# Patient Record
Sex: Male | Born: 1970 | Race: White | Hispanic: No | Marital: Married | Smoking: Former smoker
Health system: Southern US, Community
[De-identification: ages and names within clinical notes are randomized; demographics above are authoritative.]

## PROBLEM LIST (undated history)

## (undated) DIAGNOSIS — T7840XA Allergy, unspecified, initial encounter: Secondary | ICD-10-CM

## (undated) DIAGNOSIS — Z86010 Personal history of colonic polyps: Principal | ICD-10-CM

## (undated) DIAGNOSIS — K219 Gastro-esophageal reflux disease without esophagitis: Secondary | ICD-10-CM

## (undated) DIAGNOSIS — N200 Calculus of kidney: Secondary | ICD-10-CM

## (undated) DIAGNOSIS — R011 Cardiac murmur, unspecified: Secondary | ICD-10-CM

## (undated) DIAGNOSIS — G4730 Sleep apnea, unspecified: Secondary | ICD-10-CM

## (undated) DIAGNOSIS — G43909 Migraine, unspecified, not intractable, without status migrainosus: Secondary | ICD-10-CM

## (undated) DIAGNOSIS — G4733 Obstructive sleep apnea (adult) (pediatric): Secondary | ICD-10-CM

## (undated) DIAGNOSIS — Z9989 Dependence on other enabling machines and devices: Secondary | ICD-10-CM

## (undated) DIAGNOSIS — Z87442 Personal history of urinary calculi: Secondary | ICD-10-CM

## (undated) DIAGNOSIS — R509 Fever, unspecified: Secondary | ICD-10-CM

## (undated) HISTORY — DX: Personal history of colonic polyps: Z86.010

## (undated) HISTORY — DX: Fever, unspecified: R50.9

## (undated) HISTORY — PX: POLYPECTOMY: SHX149

## (undated) HISTORY — DX: Migraine, unspecified, not intractable, without status migrainosus: G43.909

## (undated) HISTORY — PX: BACK SURGERY: SHX140

## (undated) HISTORY — DX: Obstructive sleep apnea (adult) (pediatric): G47.33

## (undated) HISTORY — PX: TONSILLECTOMY: SUR1361

## (undated) HISTORY — PX: COLONOSCOPY: SHX174

## (undated) HISTORY — DX: Allergy, unspecified, initial encounter: T78.40XA

## (undated) HISTORY — DX: Obstructive sleep apnea (adult) (pediatric): Z99.89

---

## 2003-11-11 ENCOUNTER — Emergency Department (HOSPITAL_COMMUNITY): Admission: EM | Admit: 2003-11-11 | Discharge: 2003-11-11 | Payer: Self-pay | Admitting: Emergency Medicine

## 2005-04-29 ENCOUNTER — Ambulatory Visit: Payer: Self-pay | Admitting: Internal Medicine

## 2010-11-22 ENCOUNTER — Emergency Department (HOSPITAL_BASED_OUTPATIENT_CLINIC_OR_DEPARTMENT_OTHER)
Admission: EM | Admit: 2010-11-22 | Discharge: 2010-11-22 | Disposition: A | Discharge status: Home / Self Care | Payer: 59 | Attending: Emergency Medicine | Admitting: Emergency Medicine

## 2010-11-22 ENCOUNTER — Encounter: Payer: Self-pay | Admitting: *Deleted

## 2010-11-22 ENCOUNTER — Emergency Department (INDEPENDENT_AMBULATORY_CARE_PROVIDER_SITE_OTHER): Payer: 59

## 2010-11-22 ENCOUNTER — Emergency Department (HOSPITAL_BASED_OUTPATIENT_CLINIC_OR_DEPARTMENT_OTHER): Payer: 59

## 2010-11-22 DIAGNOSIS — R1031 Right lower quadrant pain: Secondary | ICD-10-CM

## 2010-11-22 DIAGNOSIS — N200 Calculus of kidney: Secondary | ICD-10-CM

## 2010-11-22 DIAGNOSIS — R109 Unspecified abdominal pain: Secondary | ICD-10-CM | POA: Insufficient documentation

## 2010-11-22 DIAGNOSIS — N201 Calculus of ureter: Secondary | ICD-10-CM

## 2010-11-22 DIAGNOSIS — R112 Nausea with vomiting, unspecified: Secondary | ICD-10-CM

## 2010-11-22 DIAGNOSIS — R319 Hematuria, unspecified: Secondary | ICD-10-CM | POA: Insufficient documentation

## 2010-11-22 LAB — DIFFERENTIAL
Basophils Absolute: 0 10*3/uL (ref 0.0–0.1)
Basophils Relative: 0 % (ref 0–1)
Eosinophils Absolute: 0.1 10*3/uL (ref 0.0–0.7)
Eosinophils Relative: 2 % (ref 0–5)
Lymphocytes Relative: 28 % (ref 12–46)
Lymphs Abs: 2 10*3/uL (ref 0.7–4.0)
Monocytes Absolute: 0.7 10*3/uL (ref 0.1–1.0)
Monocytes Relative: 9 % (ref 3–12)
Neutro Abs: 4.3 10*3/uL (ref 1.7–7.7)
Neutrophils Relative %: 61 % (ref 43–77)

## 2010-11-22 LAB — URINALYSIS, ROUTINE W REFLEX MICROSCOPIC
Bilirubin Urine: NEGATIVE
Glucose, UA: NEGATIVE mg/dL
Ketones, ur: NEGATIVE mg/dL
Leukocytes, UA: NEGATIVE
Nitrite: NEGATIVE
Protein, ur: NEGATIVE mg/dL
Specific Gravity, Urine: 1.024 (ref 1.005–1.030)
Urobilinogen, UA: 0.2 mg/dL (ref 0.0–1.0)
pH: 7 (ref 5.0–8.0)

## 2010-11-22 LAB — CBC
HCT: 43.4 % (ref 39.0–52.0)
Hemoglobin: 15.5 g/dL (ref 13.0–17.0)
MCH: 29.9 pg (ref 26.0–34.0)
MCHC: 35.7 g/dL (ref 30.0–36.0)
MCV: 83.8 fL (ref 78.0–100.0)
Platelets: 269 10*3/uL (ref 150–400)
RBC: 5.18 MIL/uL (ref 4.22–5.81)
RDW: 12.9 % (ref 11.5–15.5)
WBC: 7.1 10*3/uL (ref 4.0–10.5)

## 2010-11-22 LAB — BASIC METABOLIC PANEL
BUN: 17 mg/dL (ref 6–23)
CO2: 27 mEq/L (ref 19–32)
Calcium: 9.4 mg/dL (ref 8.4–10.5)
Chloride: 103 mEq/L (ref 96–112)
Creatinine, Ser: 1 mg/dL (ref 0.50–1.35)
GFR calc Af Amer: >60 mL/min (ref >60)
GFR calc non Af Amer: >60 mL/min (ref >60)
Glucose, Bld: 113 mg/dL — ABNORMAL HIGH (ref 70–99)
Potassium: 4.3 mEq/L (ref 3.5–5.1)
Sodium: 140 mEq/L (ref 135–145)

## 2010-11-22 LAB — URINE MICROSCOPIC-ADD ON

## 2010-11-22 MED ORDER — METOCLOPRAMIDE HCL 10 MG PO TABS: 10.0000 mg | ORAL_TABLET | Freq: Four times a day (QID) | ORAL | Status: AC

## 2010-11-22 MED ORDER — SODIUM CHLORIDE 0.9 % IV BOLUS (SEPSIS): 1000.0000 mL | Freq: Once | INTRAVENOUS | Status: AC

## 2010-11-22 MED ORDER — OXYCODONE-ACETAMINOPHEN 5-325 MG PO TABS: 1.0000 | ORAL_TABLET | ORAL | Status: AC | PRN

## 2010-11-22 MED ORDER — TAMSULOSIN HCL 0.4 MG PO CAPS: 0.4000 mg | ORAL_CAPSULE | Freq: Every day | ORAL | Status: DC

## 2010-11-22 NOTE — ED Notes (Signed)
C/o pain in right lower abdomen that goes through to his back, started this morning

## 2010-11-22 NOTE — ED Provider Notes (Addendum)
History     Chief Complaint  Patient presents with  . Abdominal Pain   Patient is a 40 y.o. male presenting with abdominal pain. The history is provided by the patient.  Abdominal Pain The primary symptoms of the illness include abdominal pain and nausea. The primary symptoms of the illness do not include fever. The current episode started 3 to 5 hours ago. The onset of the illness was gradual.  The patient has not had a change in bowel habit. Symptoms associated with the illness do not include chills.    History reviewed. No pertinent past medical history.  Past Surgical History  Procedure Date  . Tonsillectomy     History reviewed. No pertinent family history.  History  Substance Use Topics  . Smoking status: Never Smoker   . Smokeless tobacco: Not on file  . Alcohol Use: Yes     occasional      Review of Systems  Constitutional: Negative.  Negative for fever and chills.  HENT: Negative.   Respiratory: Negative.   Cardiovascular: Negative.   Gastrointestinal: Positive for nausea and abdominal pain.  Genitourinary: Negative.   Musculoskeletal: Negative.   Skin: Negative.   Neurological: Negative.     Physical Exam  BP 135/90  Pulse 73  Temp(Src) 98.2 F (36.8 C) (Oral)  Resp 22  SpO2 99%  Physical Exam  Constitutional: He appears well-developed and well-nourished.  HENT:  Head: Normocephalic.  Neck: Normal range of motion. Neck supple.  Cardiovascular: Normal rate and regular rhythm.   Pulmonary/Chest: Effort normal and breath sounds normal.  Abdominal: Soft. Bowel sounds are normal. He exhibits no mass. There is tenderness. There is no rebound and no guarding.    Musculoskeletal: Normal range of motion.  Neurological: He is alert. No cranial nerve deficit.  Skin: Skin is warm and dry. No rash noted.  Psychiatric: He has a normal mood and affect.    ED Course  Procedures Patient has no RLQ tenderness and has blood in urine - favor kidney stone  over appy. CT changed to stone protocol study. Patient continues to be pain free. Re-eval: 14:55 - patient pain free. CT results confirm ureteral stone and also confirms normal appendix. Will discharge home.  MDM       Rodena Medin, PA 11/22/10 1456  Rodena Medin, PA 11/22/10 1504  Rodena Medin, PA 03/13/11 1009  Rodena Medin, PA 04/02/11 1343

## 2011-03-17 NOTE — ED Provider Notes (Signed)
Evaluation and management procedures were performed by the PA/NP under my supervision/collaboration  Genise Strack D Kameela Leipold, MD 03/17/11 0118 

## 2011-04-05 NOTE — ED Provider Notes (Signed)
Evaluation and management procedures were performed by the PA/NP under my supervision/collaboration.    Felisa Bonier, MD 04/05/11 [phone]

## 2012-12-19 ENCOUNTER — Other Ambulatory Visit: Payer: Self-pay | Admitting: Orthopedic Surgery

## 2012-12-19 NOTE — H&P (Signed)
Jonatha Gagen is an 42 y.o. male.   Chief Complaint: back and left leg pain HPI: The patient is a 42 year old male being followed for their back pain. They are now 6 weeks out from most recent flare up. Symptoms reported today include: pain (left buttock), weakness, numbness (left great toe) and leg pain (left). Current treatment includes: physical therapy and Percocet, Robaxin, and Neurontin (improved numbness slightly). The patient reports their current pain level to be mild.  The physical therapy helped his leg pain slightly. It was traction, he is going to convert that to a home program.  PMH GERD Allergic urticaria Nephrolithiasis Sleep apnea   Past Surgical History  Procedure Laterality Date  . Tonsillectomy    nasal septoplasty Sinus surgery  No family history on file. Social History:  reports that he has never smoked. He does not have any smokeless tobacco history on file. He reports that  drinks alcohol. He reports that he does not use illicit drugs. *Former smoker  Allergies:  Allergies  Allergen Reactions  . Aspirin   . Ibuprofen   . Penicillins      (Not in a hospital admission)  No results found for this or any previous visit (from the past 48 hour(s)). No results found.  Review of Systems  Constitutional: Negative.   HENT: Negative.   Eyes: Negative.   Respiratory: Negative.   Cardiovascular: Negative.   Gastrointestinal: Negative.   Genitourinary: Negative.   Musculoskeletal: Positive for back pain.  Skin: Negative.   Neurological: Positive for sensory change and focal weakness.  Endo/Heme/Allergies: Negative.   Psychiatric/Behavioral: Negative.     There were no vitals taken for this visit. Physical Exam  Constitutional: He is oriented to person, place, and time. He appears well-developed and well-nourished. He appears distressed.  HENT:  Head: Normocephalic and atraumatic.  Eyes: Conjunctivae and EOM are normal. Pupils are equal, round, and  reactive to light.  Neck: Normal range of motion. Neck supple.  Cardiovascular: Normal rate and regular rhythm.   Respiratory: Effort normal and breath sounds normal.  GI: Soft. Bowel sounds are normal.  Musculoskeletal:  On exam he is upright in moderate distress. Mood and affect are appropriate. Straight leg raise on the left produces marked buttock, thigh, and calf pain; on the right it is negative. Decreased sensation L5 dermatome on the left compared to the right. EHL is 4/5. He has slightly diminished plantar flexion.  Neurological: He is alert and oriented to person, place, and time. He has normal reflexes.  Skin: Skin is warm and dry.    xrays Lspine Three views demonstrate disc degeneration at 3-4, 4-5, and 5-1. No instability in flexion and extension.  MRI demonstrates predominantly disc herniation at 4-5, compression of the 5 root. There is lateral recess stenosis, multifactorial, at 5-1. Central disc at 3-4.  Assessment/Plan 1. 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 2. He does have radicular pain into the S1 nerve root distribution as well on the left, not at the L4 nerve root distribution. He does have lateral recess stenosis and protrusion there as well. He does not have any neurotension signs or focal neurological deficits on the right.  I had an extensive discussion with the patient concerning current pathology, relevant anatomy and treatment options. He is close to three and a half to four weeks into his symptomatology. He has tried therapy, analgesics,  activity modifications, the presence of a neurocompressive lesion with myotomal weakness and dermatomal dysesthesias not improving, recommend decompression at 4-5. Very well may be appropriate to decompress 5-1 as well. There is disc degeneration and  disc herniation at 3-4, he has no 4 symptoms.  At this point in time the difficult issue is that he does have multilevel disc degeneration. He has predominantly buttock and leg pain. He has had history of back pain and he does have significant disc degeneration at those levels. He has had overwhelming pain generators, the compression of the 5 root and I would recommend micro lumbar decompression of the 5 root. We, however, discussed the risks and benefits.  I had an extensive discussion of the risks and benefits of lumbar decompression with the patient including bleeding, infection, damage to neurovascular structures, epidural fibrosis, CSF leakage requiring repair. We also discussed increase in pain, adjacent segment disease, recurrent disc herniation, need for future surgery including repeat decompression and/or fusion. We also discussed risks of postoperative hematoma, paralysis, anesthetic complications including DVT, PE, death, cardiopulmonary dysfunction. In addition, the perioperative and postoperative courses were discussed in detail including the rehabilitative time and return to functional activity and work. I provided the patient with an illustrated handout and utilized the appropriate surgical models.  Particularly discussed the requirement for an associated fusion. Again, with his three level disease any fusion at 4-5 and 5-1 would require an extension to 3-4 or at least have a significant risk for adjacent segment disease in the near future. Weight reduction, activity modifications would be of benefit as well. I do not feel series of epidurals would be appropriate given the presence of a neurocompressive lesion. I do feel that this should be performed on an urgent basis within a week. However, they are going to obtain a second opinion, and again we are trying to avoid the presence of progressive neurological deficit, foot drop, and etc. I have indicated that if it dramatically worsens  he should call, consider emergent decompression. We will wait to hear from them.  Plan: microlumbar decompression L4-5, L5-S1 left   BISSELL, JACLYN M. for Dr. Shelle Iron 12/19/2012, 1:57 PM

## 2012-12-29 ENCOUNTER — Encounter (HOSPITAL_COMMUNITY): Payer: Self-pay | Admitting: Pharmacy Technician

## 2012-12-29 NOTE — Patient Instructions (Signed)
Chaka Jefferys  12/29/2012   Your procedure is scheduled on:  01/04/13               Surgery 1000am-12noon  Report to [facility] at      0730 AM.  Call this number if you have problems the morning of surgery: [phone]   Remember:   Do not eat food or drink liquids after midnight.   Take these medicines the morning of surgery with A SIP OF WATER:    Do not wear jewelry,   Do not wear lotions, powders, or perfumes.    Men may shave face and neck.  Do not bring valuables to the hospital.  Contacts, dentures or bridgework may not be worn into surgery.  Leave suitcase in the car. After surgery it may be brought to your room.  For patients admitted to the hospital, checkout time is 11:00 AM the day of  discharge.     SEE CHG INSTRUCTION SHEET    Please read over the following fact sheets that you were given: MRSA Information, coughing and deep breathing exercises, leg exercises, Incentive Spirometry Fact Sheet                Failure to comply with these instructions may result in cancellation of your surgery.                Patient Signature ____________________________              Nurse Signature _____________________________

## 2012-12-30 ENCOUNTER — Encounter (HOSPITAL_COMMUNITY)
Admission: RE | Admit: 2012-12-30 | Discharge: 2012-12-30 | Disposition: A | Discharge status: Home / Self Care | Payer: BC Managed Care – PPO | Source: Ambulatory Visit | Attending: Specialist | Admitting: Specialist

## 2012-12-30 ENCOUNTER — Encounter (HOSPITAL_COMMUNITY): Payer: Self-pay

## 2012-12-30 ENCOUNTER — Ambulatory Visit (HOSPITAL_COMMUNITY)
Admission: RE | Admit: 2012-12-30 | Discharge: 2012-12-30 | Disposition: A | Discharge status: Home / Self Care | Payer: BC Managed Care – PPO | Source: Ambulatory Visit | Attending: Orthopedic Surgery | Admitting: Orthopedic Surgery

## 2012-12-30 DIAGNOSIS — M5137 Other intervertebral disc degeneration, lumbosacral region: Secondary | ICD-10-CM | POA: Insufficient documentation

## 2012-12-30 DIAGNOSIS — M51379 Other intervertebral disc degeneration, lumbosacral region without mention of lumbar back pain or lower extremity pain: Secondary | ICD-10-CM | POA: Insufficient documentation

## 2012-12-30 HISTORY — DX: Gastro-esophageal reflux disease without esophagitis: K21.9

## 2012-12-30 HISTORY — DX: Cardiac murmur, unspecified: R01.1

## 2012-12-30 HISTORY — DX: Calculus of kidney: N20.0

## 2012-12-30 HISTORY — DX: Sleep apnea, unspecified: G47.30

## 2012-12-30 LAB — CBC
HCT: 43.4 % (ref 39.0–52.0)
Hemoglobin: 15.3 g/dL (ref 13.0–17.0)
MCH: 30.2 pg (ref 26.0–34.0)
MCHC: 35.3 g/dL (ref 30.0–36.0)
MCV: 85.6 fL (ref 78.0–100.0)
Platelets: 262 10*3/uL (ref 150–400)
RBC: 5.07 MIL/uL (ref 4.22–5.81)
RDW: 12.6 % (ref 11.5–15.5)
WBC: 6.8 10*3/uL (ref 4.0–10.5)

## 2012-12-30 LAB — BASIC METABOLIC PANEL
BUN: 16 mg/dL (ref 6–23)
CO2: 30 mEq/L (ref 19–32)
Calcium: 9.6 mg/dL (ref 8.4–10.5)
Chloride: 105 mEq/L (ref 96–112)
Creatinine, Ser: 1.31 mg/dL (ref 0.50–1.35)
GFR calc Af Amer: 77 mL/min — ABNORMAL LOW (ref >90)
GFR calc non Af Amer: 66 mL/min — ABNORMAL LOW (ref >90)
Glucose, Bld: 100 mg/dL — ABNORMAL HIGH (ref 70–99)
Potassium: 4.4 mEq/L (ref 3.5–5.1)
Sodium: 141 mEq/L (ref 135–145)

## 2012-12-30 LAB — SURGICAL PCR SCREEN
MRSA, PCR: NEGATIVE
Staphylococcus aureus: NEGATIVE

## 2013-01-02 NOTE — Progress Notes (Signed)
Patient allergic to penicillins- causes hives.  Ancef order preop.  Surgery on 01/04/13.

## 2013-01-04 ENCOUNTER — Inpatient Hospital Stay (HOSPITAL_COMMUNITY)
Admission: RE | Admit: 2013-01-04 | Discharge: 2013-01-04 | DRG: 758 | Disposition: A | Discharge status: Home / Self Care | Payer: BC Managed Care – PPO | Source: Ambulatory Visit | Attending: Specialist | Admitting: Specialist

## 2013-01-04 ENCOUNTER — Ambulatory Visit (HOSPITAL_COMMUNITY): Payer: BC Managed Care – PPO

## 2013-01-04 ENCOUNTER — Encounter (HOSPITAL_COMMUNITY)
Admission: RE | Disposition: A | Discharge status: Home / Self Care | Payer: Self-pay | Source: Ambulatory Visit | Attending: Specialist

## 2013-01-04 ENCOUNTER — Encounter (HOSPITAL_COMMUNITY): Payer: Self-pay | Admitting: Certified Registered"

## 2013-01-04 ENCOUNTER — Ambulatory Visit (HOSPITAL_COMMUNITY): Payer: BC Managed Care – PPO | Admitting: Certified Registered"

## 2013-01-04 ENCOUNTER — Encounter (HOSPITAL_COMMUNITY): Payer: Self-pay

## 2013-01-04 DIAGNOSIS — Z880 Allergy status to penicillin: Secondary | ICD-10-CM

## 2013-01-04 DIAGNOSIS — G4730 Sleep apnea, unspecified: Secondary | ICD-10-CM | POA: Diagnosis present

## 2013-01-04 DIAGNOSIS — Z87442 Personal history of urinary calculi: Secondary | ICD-10-CM

## 2013-01-04 DIAGNOSIS — M5126 Other intervertebral disc displacement, lumbar region: Secondary | ICD-10-CM

## 2013-01-04 DIAGNOSIS — K219 Gastro-esophageal reflux disease without esophagitis: Secondary | ICD-10-CM | POA: Diagnosis present

## 2013-01-04 HISTORY — PX: LUMBAR LAMINECTOMY/DECOMPRESSION MICRODISCECTOMY: SHX5026

## 2013-01-04 SURGERY — LUMBAR LAMINECTOMY/DECOMPRESSION MICRODISCECTOMY 2 LEVELS
Anesthesia: General | Site: Back | Laterality: Left | Wound class: Clean

## 2013-01-04 MED ORDER — MEPERIDINE HCL 50 MG/ML IJ SOLN: 6.2500 mg | INTRAMUSCULAR | Status: DC | PRN

## 2013-01-04 MED ORDER — DEXTROSE 5 % IV SOLN
3.0000 g | Freq: Once | INTRAVENOUS | Status: AC
  Administered 2013-01-04: 3 g via INTRAVENOUS
  Filled 2013-01-04: qty 3000

## 2013-01-04 MED ORDER — LACTATED RINGERS IV SOLN: INTRAVENOUS | Status: DC

## 2013-01-04 MED ORDER — LIDOCAINE HCL (CARDIAC) 20 MG/ML IV SOLN
INTRAVENOUS | Status: DC | PRN
  Administered 2013-01-04: 100 mg via INTRAVENOUS

## 2013-01-04 MED ORDER — ZOLPIDEM TARTRATE 5 MG PO TABS: 5.0000 mg | ORAL_TABLET | Freq: Every evening | ORAL | Status: DC | PRN

## 2013-01-04 MED ORDER — BISACODYL 5 MG PO TBEC: 5.0000 mg | DELAYED_RELEASE_TABLET | Freq: Every day | ORAL | Status: DC | PRN

## 2013-01-04 MED ORDER — SODIUM CHLORIDE 0.9 % IV SOLN: 250.0000 mL | INTRAVENOUS | Status: DC

## 2013-01-04 MED ORDER — ONDANSETRON HCL 4 MG/2ML IJ SOLN: 4.0000 mg | INTRAMUSCULAR | Status: DC | PRN

## 2013-01-04 MED ORDER — METHOCARBAMOL 500 MG PO TABS: 500.0000 mg | ORAL_TABLET | Freq: Four times a day (QID) | ORAL | Status: DC | PRN

## 2013-01-04 MED ORDER — CEFAZOLIN SODIUM-DEXTROSE 2-3 GM-% IV SOLR: 2.0000 g | INTRAVENOUS | Status: DC

## 2013-01-04 MED ORDER — ACETAMINOPHEN 650 MG RE SUPP: 650.0000 mg | RECTAL | Status: DC | PRN

## 2013-01-04 MED ORDER — HYDROMORPHONE HCL PF 1 MG/ML IJ SOLN
0.2500 mg | INTRAMUSCULAR | Status: DC | PRN
  Administered 2013-01-04: 0.5 mg via INTRAVENOUS

## 2013-01-04 MED ORDER — ACETAMINOPHEN 325 MG PO TABS: 650.0000 mg | ORAL_TABLET | ORAL | Status: DC | PRN

## 2013-01-04 MED ORDER — HYDROCODONE-ACETAMINOPHEN 5-325 MG PO TABS: 1.0000 | ORAL_TABLET | ORAL | Status: DC | PRN

## 2013-01-04 MED ORDER — NEOSTIGMINE METHYLSULFATE 1 MG/ML IJ SOLN
INTRAMUSCULAR | Status: DC | PRN
  Administered 2013-01-04: 4 mg via INTRAVENOUS

## 2013-01-04 MED ORDER — ALUM & MAG HYDROXIDE-SIMETH 200-200-20 MG/5ML PO SUSP: 30.0000 mL | Freq: Four times a day (QID) | ORAL | Status: DC | PRN

## 2013-01-04 MED ORDER — MENTHOL 3 MG MT LOZG: 1.0000 | LOZENGE | OROMUCOSAL | Status: DC | PRN

## 2013-01-04 MED ORDER — SENNOSIDES-DOCUSATE SODIUM 8.6-50 MG PO TABS
1.0000 | ORAL_TABLET | Freq: Every evening | ORAL | Status: DC | PRN
  Filled 2013-01-04: qty 1

## 2013-01-04 MED ORDER — DEXTROSE 5 % IV SOLN: 3.0000 g | INTRAVENOUS | Status: DC | PRN

## 2013-01-04 MED ORDER — MAGNESIUM CITRATE PO SOLN: 1.0000 | Freq: Once | ORAL | Status: DC | PRN

## 2013-01-04 MED ORDER — DEXTROSE 5 % IV SOLN
3.0000 g | Freq: Three times a day (TID) | INTRAVENOUS | Status: DC
  Administered 2013-01-04: 3 g via INTRAVENOUS
  Filled 2013-01-04: qty 3000

## 2013-01-04 MED ORDER — SODIUM CHLORIDE 0.9 % IJ SOLN: 3.0000 mL | Freq: Two times a day (BID) | INTRAMUSCULAR | Status: DC

## 2013-01-04 MED ORDER — SODIUM CHLORIDE 0.9 % IR SOLN
Status: DC | PRN
  Administered 2013-01-04: 11:00:00

## 2013-01-04 MED ORDER — BUPIVACAINE-EPINEPHRINE 0.5% -1:200000 IJ SOLN
INTRAMUSCULAR | Status: DC | PRN
  Administered 2013-01-04: 20 mL

## 2013-01-04 MED ORDER — PROMETHAZINE HCL 25 MG/ML IJ SOLN: 6.2500 mg | INTRAMUSCULAR | Status: DC | PRN

## 2013-01-04 MED ORDER — DEXAMETHASONE SODIUM PHOSPHATE 10 MG/ML IJ SOLN
INTRAMUSCULAR | Status: DC | PRN
  Administered 2013-01-04: 10 mg via INTRAVENOUS

## 2013-01-04 MED ORDER — ROCURONIUM BROMIDE 100 MG/10ML IV SOLN
INTRAVENOUS | Status: DC | PRN
  Administered 2013-01-04: 45 mg via INTRAVENOUS
  Administered 2013-01-04: 5 mg via INTRAVENOUS

## 2013-01-04 MED ORDER — SUCCINYLCHOLINE CHLORIDE 20 MG/ML IJ SOLN
INTRAMUSCULAR | Status: DC | PRN
  Administered 2013-01-04: 120 mg via INTRAVENOUS

## 2013-01-04 MED ORDER — ONDANSETRON HCL 4 MG/2ML IJ SOLN
INTRAMUSCULAR | Status: DC | PRN
  Administered 2013-01-04: 4 mg via INTRAVENOUS

## 2013-01-04 MED ORDER — SODIUM CHLORIDE 0.9 % IJ SOLN: 3.0000 mL | INTRAMUSCULAR | Status: DC | PRN

## 2013-01-04 MED ORDER — FENTANYL CITRATE 0.05 MG/ML IJ SOLN
INTRAMUSCULAR | Status: DC | PRN
  Administered 2013-01-04 (×3): 50 ug via INTRAVENOUS
  Administered 2013-01-04: 100 ug via INTRAVENOUS

## 2013-01-04 MED ORDER — OXYCODONE-ACETAMINOPHEN 7.5-325 MG PO TABS: 1.0000 | ORAL_TABLET | ORAL | Status: DC | PRN

## 2013-01-04 MED ORDER — CHLORHEXIDINE GLUCONATE 4 % EX LIQD
60.0000 mL | Freq: Once | CUTANEOUS | Status: DC
  Filled 2013-01-04: qty 60

## 2013-01-04 MED ORDER — SODIUM CHLORIDE 0.45 % IV SOLN: INTRAVENOUS | Status: DC

## 2013-01-04 MED ORDER — THROMBIN 5000 UNITS EX SOLR
CUTANEOUS | Status: DC | PRN
  Administered 2013-01-04: 10000 [IU] via TOPICAL

## 2013-01-04 MED ORDER — GLYCOPYRROLATE 0.2 MG/ML IJ SOLN
INTRAMUSCULAR | Status: DC | PRN
  Administered 2013-01-04: 0.6 mg via INTRAVENOUS

## 2013-01-04 MED ORDER — CLINDAMYCIN PHOSPHATE 900 MG/50ML IV SOLN
900.0000 mg | Freq: Once | INTRAVENOUS | Status: AC
  Administered 2013-01-04: 900 mg via INTRAVENOUS

## 2013-01-04 MED ORDER — OXYCODONE-ACETAMINOPHEN 5-325 MG PO TABS: 1.0000 | ORAL_TABLET | ORAL | Status: DC | PRN

## 2013-01-04 MED ORDER — PROPOFOL 10 MG/ML IV BOLUS
INTRAVENOUS | Status: DC | PRN
  Administered 2013-01-04: 200 mg via INTRAVENOUS

## 2013-01-04 MED ORDER — MIDAZOLAM HCL 5 MG/5ML IJ SOLN
INTRAMUSCULAR | Status: DC | PRN
  Administered 2013-01-04: 2 mg via INTRAVENOUS

## 2013-01-04 MED ORDER — PHENOL 1.4 % MT LIQD: 1.0000 | OROMUCOSAL | Status: DC | PRN

## 2013-01-04 MED ORDER — DOCUSATE SODIUM 100 MG PO CAPS: 100.0000 mg | ORAL_CAPSULE | Freq: Two times a day (BID) | ORAL | Status: DC

## 2013-01-04 MED ORDER — METHOCARBAMOL 100 MG/ML IJ SOLN
500.0000 mg | Freq: Four times a day (QID) | INTRAVENOUS | Status: DC | PRN
  Administered 2013-01-04: 500 mg via INTRAVENOUS
  Filled 2013-01-04: qty 5

## 2013-01-04 MED ORDER — LACTATED RINGERS IV SOLN
INTRAVENOUS | Status: DC
  Administered 2013-01-04: 1000 mL via INTRAVENOUS
  Administered 2013-01-04: 12:00:00 via INTRAVENOUS

## 2013-01-04 MED ORDER — GABAPENTIN 300 MG PO CAPS
300.0000 mg | ORAL_CAPSULE | Freq: Three times a day (TID) | ORAL | Status: DC
  Administered 2013-01-04: 300 mg via ORAL
  Filled 2013-01-04 (×2): qty 1

## 2013-01-04 MED ORDER — HYDROMORPHONE HCL PF 1 MG/ML IJ SOLN: 0.5000 mg | INTRAMUSCULAR | Status: DC | PRN

## 2013-01-04 SURGICAL SUPPLY — 60 items
APL SKNCLS STERI-STRIP NONHPOA (GAUZE/BANDAGES/DRESSINGS) ×1
BAG SPEC THK2 15X12 ZIP CLS (MISCELLANEOUS) ×1
BAG ZIPLOCK 12X15 (MISCELLANEOUS) ×2 IMPLANT
BENZOIN TINCTURE PRP APPL 2/3 (GAUZE/BANDAGES/DRESSINGS) ×3 IMPLANT
BLADE SURG ROTATE 9660 (MISCELLANEOUS) ×1 IMPLANT
CHLORAPREP W/TINT 26ML (MISCELLANEOUS) IMPLANT
CLEANER TIP ELECTROSURG 2X2 (MISCELLANEOUS) ×2 IMPLANT
CLOSURE STERI-STRIP 1/4X4 (GAUZE/BANDAGES/DRESSINGS) ×1 IMPLANT
CLOTH 2% CHLOROHEXIDINE 3PK (PERSONAL CARE ITEMS) ×2 IMPLANT
CLOTH BEACON ORANGE TIMEOUT ST (SAFETY) ×2 IMPLANT
DECANTER SPIKE VIAL GLASS SM (MISCELLANEOUS) ×2 IMPLANT
DRAPE MICROSCOPE LEICA (MISCELLANEOUS) ×2 IMPLANT
DRAPE POUCH INSTRU U-SHP 10X18 (DRAPES) ×2 IMPLANT
DRAPE SURG 17X11 SM STRL (DRAPES) ×2 IMPLANT
DRSG AQUACEL AG ADV 3.5X 6 (GAUZE/BANDAGES/DRESSINGS) ×1 IMPLANT
DRSG EMULSION OIL 3X3 NADH (GAUZE/BANDAGES/DRESSINGS) IMPLANT
DRSG PAD ABDOMINAL 8X10 ST (GAUZE/BANDAGES/DRESSINGS) IMPLANT
DRSG TELFA 4X5 ISLAND ADH (GAUZE/BANDAGES/DRESSINGS) IMPLANT
DURAFORM SPONGE 2X2 SINGLE (Neuro Prosthesis/Implant) ×2 IMPLANT
DURAPREP 26ML APPLICATOR (WOUND CARE) ×2 IMPLANT
DURASEAL SPINE SEALANT 3ML (MISCELLANEOUS) ×1 IMPLANT
ELECT BLADE TIP CTD 4 INCH (ELECTRODE) ×1 IMPLANT
ELECT REM PT RETURN 9FT ADLT (ELECTROSURGICAL) ×2
ELECTRODE REM PT RTRN 9FT ADLT (ELECTROSURGICAL) ×1 IMPLANT
GLOVE BIOGEL PI IND STRL 7.5 (GLOVE) ×1 IMPLANT
GLOVE BIOGEL PI IND STRL 8 (GLOVE) ×1 IMPLANT
GLOVE BIOGEL PI INDICATOR 7.5 (GLOVE) ×1
GLOVE BIOGEL PI INDICATOR 8 (GLOVE) ×1
GLOVE SURG SS PI 7.5 STRL IVOR (GLOVE) ×2 IMPLANT
GLOVE SURG SS PI 8.0 STRL IVOR (GLOVE) ×4 IMPLANT
GOWN PREVENTION PLUS LG XLONG (DISPOSABLE) ×3 IMPLANT
GOWN STRL REIN XL XLG (GOWN DISPOSABLE) ×4 IMPLANT
IV CATH 14GX2 1/4 (CATHETERS) ×2 IMPLANT
KIT BASIN OR (CUSTOM PROCEDURE TRAY) ×2 IMPLANT
KIT POSITIONING SURG ANDREWS (MISCELLANEOUS) ×2 IMPLANT
MANIFOLD NEPTUNE II (INSTRUMENTS) ×2 IMPLANT
NDL SPNL 18GX3.5 QUINCKE PK (NEEDLE) ×3 IMPLANT
NEEDLE SPNL 18GX3.5 QUINCKE PK (NEEDLE) ×6 IMPLANT
PATTIES SURGICAL .5 X.5 (GAUZE/BANDAGES/DRESSINGS) IMPLANT
PATTIES SURGICAL .75X.75 (GAUZE/BANDAGES/DRESSINGS) IMPLANT
PATTIES SURGICAL 1X1 (DISPOSABLE) IMPLANT
SPONGE SURGIFOAM ABS GEL 100 (HEMOSTASIS) ×2 IMPLANT
STAPLER VISISTAT (STAPLE) IMPLANT
STRIP CLOSURE SKIN 1/2X4 (GAUZE/BANDAGES/DRESSINGS) IMPLANT
SUT NURALON 4 0 TR CR/8 (SUTURE) ×1 IMPLANT
SUT PROLENE 3 0 PS 2 (SUTURE) IMPLANT
SUT VIC AB 0 CT1 27 (SUTURE)
SUT VIC AB 0 CT1 27XBRD ANTBC (SUTURE) IMPLANT
SUT VIC AB 1 CT1 27 (SUTURE) ×2
SUT VIC AB 1 CT1 27XBRD ANTBC (SUTURE) ×1 IMPLANT
SUT VIC AB 1-0 CT2 27 (SUTURE) ×2 IMPLANT
SUT VIC AB 2-0 CT1 27 (SUTURE) ×2
SUT VIC AB 2-0 CT1 TAPERPNT 27 (SUTURE) ×1 IMPLANT
SUT VIC AB 2-0 CT2 27 (SUTURE) ×3 IMPLANT
SUT VICRYL 0 27 CT2 27 ABS (SUTURE) ×3 IMPLANT
SUT VICRYL 0 UR6 27IN ABS (SUTURE) IMPLANT
SYR 3ML LL SCALE MARK (SYRINGE) ×1 IMPLANT
SYRINGE 10CC LL (SYRINGE) ×4 IMPLANT
TRAY LAMINECTOMY (CUSTOM PROCEDURE TRAY) ×2 IMPLANT
YANKAUER SUCT BULB TIP NO VENT (SUCTIONS) ×2 IMPLANT

## 2013-01-04 NOTE — Interval H&P Note (Signed)
History and Physical Interval Note:  01/04/2013 10:06 AM  Brett Schaefer  has presented today for surgery, with the diagnosis of HNP STENOSIS L4-S1   The various methods of treatment have been discussed with the patient and family. After consideration of risks, benefits and other options for treatment, the patient has consented to  Procedure(s): MICRO LUMBAR DECOMPRESSION L4-S1 LEFT    (2 LEVELS) (Left) as a surgical intervention .  The patient's history has been reviewed, patient examined, no change in status, stable for surgery.  I have reviewed the patient's chart and labs.  Questions were answered to the patient's satisfaction.     Termaine Roupp,Karthik C

## 2013-01-04 NOTE — Anesthesia Postprocedure Evaluation (Signed)
  Anesthesia Post-op Note  Patient: Brett Schaefer  Procedure(s) Performed: Procedure(s) (LRB): MICRO LUMBAR DECOMPRESSION L4-S1 LEFT    (2 LEVELS)Discectomy at L4-L5 (Left)  Patient Location: PACU  Anesthesia Type: General  Level of Consciousness: awake and alert   Airway and Oxygen Therapy: Patient Spontanous Breathing  Post-op Pain: mild  Post-op Assessment: Post-op Vital signs reviewed, Patient's Cardiovascular Status Stable, Respiratory Function Stable, Patent Airway and No signs of Nausea or vomiting  Last Vitals:  Filed Vitals:   01/04/13 1300  BP: 129/60  Pulse: 89  Temp: 37.2 C  Resp: 22    Post-op Vital Signs: stable   Complications: No apparent anesthesia complications

## 2013-01-04 NOTE — Brief Op Note (Signed)
01/04/2013  12:35 PM  PATIENT:  Brett Schaefer  42 y.o. male  PRE-OPERATIVE DIAGNOSIS:  HNP STENOSIS L4-S1   POST-OPERATIVE DIAGNOSIS:  HNP STENOSIS L4-S1   PROCEDURE:  Procedure(s): MICRO LUMBAR DECOMPRESSION L4-S1 LEFT    (2 LEVELS)Discectomy at L4-L5 (Left)  SURGEON:  Surgeon(s) and Role:    * Javier Docker, MD - Primary  PHYSICIAN ASSISTANT:   ASSISTANTS: Bissell   ANESTHESIA:   general  EBL:  Total I/O In: 1000 [I.V.:1000] Out: -   BLOOD ADMINISTERED:none  DRAINS: none   LOCAL MEDICATIONS USED:  MARCAINE     SPECIMEN:  Source of Specimen:  [id number]  DISPOSITION OF SPECIMEN:  PATHOLOGY  COUNTS:  YES  TOURNIQUET:  * No tourniquets in log *  DICTATION: .Other Dictation: Dictation Number    [id number]  PLAN OF CARE: Admit for overnight observation  PATIENT DISPOSITION:  PACU - hemodynamically stable.   Delay start of Pharmacological VTE agent (>24hrs) due to surgical blood loss or risk of bleeding: yes

## 2013-01-04 NOTE — Anesthesia Preprocedure Evaluation (Signed)
Anesthesia Evaluation  Patient identified by MRN, date of birth, ID band Patient awake    Reviewed: Allergy & Precautions, H&P , NPO status , Patient's Chart, lab work & pertinent test results  Airway Mallampati: III TM Distance: >3 FB Neck ROM: Full    Dental no notable dental hx.    Pulmonary sleep apnea and Continuous Positive Airway Pressure Ventilation ,  breath sounds clear to auscultation  Pulmonary exam normal       Cardiovascular negative cardio ROS  Rhythm:Regular Rate:Normal     Neuro/Psych negative neurological ROS  negative psych ROS   GI/Hepatic Neg liver ROS, GERD-  ,  Endo/Other  negative endocrine ROS  Renal/GU negative Renal ROS  negative genitourinary   Musculoskeletal negative musculoskeletal ROS (+)   Abdominal   Peds negative pediatric ROS (+)  Hematology negative hematology ROS (+)   Anesthesia Other Findings Front tooth capped  Reproductive/Obstetrics negative OB ROS                           Anesthesia Physical Anesthesia Plan  ASA: II  Anesthesia Plan: General   Post-op Pain Management:    Induction: Intravenous  Airway Management Planned: Oral ETT  Additional Equipment:   Intra-op Plan:   Post-operative Plan: Extubation in OR  Informed Consent: I have reviewed the patients History and Physical, chart, labs and discussed the procedure including the risks, benefits and alternatives for the proposed anesthesia with the patient or authorized representative who has indicated his/her understanding and acceptance.   Dental advisory given  Plan Discussed with: CRNA  Anesthesia Plan Comments:         Anesthesia Quick Evaluation

## 2013-01-04 NOTE — Op Note (Signed)
NAMEDYLIN, BREEDEN NO.:  [id number]  MEDICAL RECORD NO.:  [id number]  LOCATION:  1602                         FACILITY:  [facility]  PHYSICIAN:  Jene Every, M.D.    DATE OF BIRTH:  [birth year]  DATE OF PROCEDURE:  01/04/2013 DATE OF DISCHARGE:  01/04/2013                              OPERATIVE REPORT   PREOPERATIVE DIAGNOSIS:  Herniated nucleus pulposus, spinal stenosis, L4- 5 and L5-S1, left.  POSTOPERATIVE DIAGNOSIS:  Herniated nucleus pulposus, spinal stenosis, L4-5 and L5-S1, left.  PROCEDURES PERFORMED: 1. Microlumbar decompression L4-5 left with foraminotomies of L4 and     L5 with microdiskectomy, 4-5, left. 2. Microlumbar decompression, L5-S1 with foraminotomies L5-S1,     decompression of the lateral recess.  ANESTHESIA:  General.  ASSISTANT:  Lanna Poche, PA.  He was used for the patient's assistance, positioning in gentle intermittent neural traction, dissection, help under the operating microscope throughout the case.  BRIEF HISTORY:  This is a 42 year old who has had persistent refractory left lower extremity radicular pain.  He had a deficit of his EHL and dorsiflexion and numbness in the L5-S1 nerve root distribution despite rest, activity modification, injections, and Dosepaks.  He was indicated for lumbar decompression at 4-5 and at 5-1.  The patient had elected to continue with conservative treatment for an additional 4 weeks period of time to determine whether there is any improvement in his deficit, however, indicated the duration of neural compression is important in ventral nerve recovery.  Discussed risks and benefits of the procedure including bleeding, infection, damage to neurovascular structures, DVT, PE, anesthetic complications, worsening of symptoms, no change in symptoms, need for fusion in the future.  He did have a disk degeneration at 3-4 and disk without neural compression.  He had no L4 signs.  He did have a  disk at 5-1 central, it was more of a lateral recess displacing the S1 nerve roots in the lateral recess.  He had significant effacement of the 5 root in the lateral recess on the left.  TECHNIQUE:  With the patient in supine position, after induction of adequate general anesthesia, 3 g of Kefzol, he was placed prone on the [place] frame.  All bony prominences were well padded.  We utilized 900 clindamycin as well.  The patient was tested negative for MRSA.  He had multiple healed cutaneous lesions sometime seen with morcellation. After he was placed in the supine position on the [place] frame, all bony prominences were well padded.  Lumbar region was prepped and draped in usual sterile fashion.  Two 18-gauge spinal needle was utilized to localize the 4-5 and 5-1 interspace.  After confirmation, incision was made from below the spinous process of L4 to just above S1, subcutaneous tissue was dissected.  Electrocautery was utilized to achieve hemostasis.  Dorsolumbar fascia identified and divided in line with skin incision.  0.25% Marcaine with epinephrine was utilized to infiltrate the subcuticular region.  McCullough retractor was placed and operating microscope was draped, brought on the surgical field.  Attention was turned towards the most symptomatic level of 4-5 confirmed by radiograph.  Hemilaminotomy of the caudad edge of 4 was performed with a 2  and 3 mm Kerrison preserving the end portion of the pars. Hypertrophic facet was noted.  Ligamentum flavum then detached from the cephalad edge of 5 utilizing a straight microcurette.  Following this, we performed a foraminotomy of 5.  We then removed the ligamentum flavum from the interspace with the neural patty placed beneath the ligamentum flavum.  Noted was the 5 root compressing the lateral recess, it was epidural venous plexus, facet hypertrophy, and ligamentum flavum hypertrophy as well as a disk herniation.  There was epidural  venous plexus tethering the 5 root.  This was cauterized and divided.  We gently mobilized the 5 root medially and decompressed the lateral recess to the medial border of pedicle with a 2 mm Kerrison.  I performed a foraminotomy of L4 as well.  After decompressing the lateral recess, I treated a small extruded fragment with micropituitary.  I performed a vertical annulotomy minimizing that and removed multiple small fragments sub annularly straight and towards the center of the disk.  After multiple passes were made, we then used a Woodson retractor to deliver disk into the disk space, checked beneath the thecal sac, the shoulder, and the axilla of the root as well as into the foramen.  No residual disk herniation was noted after multiple passes were made.  He did have a osteophyte in the central ridge, but that was not compressing the root.  After the full diskectomy and the lateral recess decompression, we had 1 cm of excursion of the 5 root medial to the pedicle without tension.  The nerve root was intact, erythematous, and edematous. Copiously irrigated disk space with antibiotic irrigation.  We had obtained confirmatory radiographs.  Inspection revealed no CSF leakage or active bleeding.  We felt we had satisfactorily decompressed the 5 root.  Attention was turned towards the interlaminar space of 5-1 translated in [place].  In a similar fashion, hemilaminotomy of the caudad edge of 5 was performed with the 2 and 3 mm Kerrison detaching ligamentum flavum from the cephalad edge of S1 utilizing straight microcurette.  I performed careful foraminotomy of S1.  Placed a neuro patty beneath the ligamentum flavum, removed ligamentum flavum from the interspace.  The S1 nerve root was compressed and the lateral recess was gently mobilized medially with the Dumont.  Used a D'Errico and decompressed the lateral recess to the medial border of the pedicle as there was hypertrophy of facet  here noted.  There was a small bulging disk centrally, however, I did not feel it was herniated and following the decompression and foraminotomy, the S1 nerve root, which was compressing the lateral recess was now free at 1 cm of excursion of the S1 nerve root medial to the pedicle without extension.  Confirmatory radiograph obtained.  We checked beneath thecal sac the axilla of the shoulder, roots, and both foramen.  No disk herniation, no lesions were noted.  Copiously irrigated both wounds.  Inspection revealed no CSF leakage or active bleeding.  Dorsolumbar fascia was reapproximated with 1 Vicryl interrupted figure-of-eight sutures, subcu with 2, skin reapproximated with Prolene.  Sterile dressing applied.  Placed supine on the hospital bed, extubated without difficulty, and transported to the recovery room in a satisfactory condition.  The patient tolerated the procedure well.  No complications.  Blood loss was approximately 25 mL.  SPECIMEN:  L4-5 disk to Pathology.     Jene Every, M.D.     Cordelia Pen  D:  01/04/2013  T:  01/04/2013  Job:  [id number]

## 2013-01-04 NOTE — Evaluation (Signed)
Physical Therapy Evaluation Patient Details Name: Brett Schaefer MRN: [id number] DOB: 14-Nov-1970 Today's Date: 01/04/2013 Time: 9147-8295 PT Time Calculation (min): 25 min  PT Assessment / Plan / Recommendation History of Present Illness     Clinical Impression  Pt with p/o decompression of L4-S1 PODO today, tolerated mobility well. Pt with very little pain and more now in surgical area than prior to surgery pain was radicular down posterior B LE (R>L). Pt will very little to no radicular pain at this time. Educated patient extensively on back precautions, best movement and movement progression with rest and ice initially. Pt at Mod I level with basic mobility and will have spouse and family to assist with other things. Pt aware and understands proper body mechanics and demonstrated proper bed mobility for back safety. Pt safe for discharge from our mobility standpoint. No PT f/u needs at this time and no equipment needs.      PT Assessment  Patent does not need any further PT services    Follow Up Recommendations  No PT follow up    Does the patient have the potential to tolerate intense rehabilitation      Barriers to Discharge        Equipment Recommendations  None recommended by PT    Recommendations for Other Services     Frequency      Precautions / Restrictions Precautions Precautions: Back Precaution Comments: back precautions reviewed and demonstarted    Pertinent Vitals/Pain 2/10 in low back area and slightly to left of incision       Mobility  Bed Mobility Bed Mobility: Rolling Left;Left Sidelying to Sit Rolling Left: 5: Supervision Left Sidelying to Sit: 5: Supervision Details for Bed Mobility Assistance: cues for log roll and how to perform for back safety Transfers Transfers: Sit to Stand;Stand to Sit Sit to Stand: 5: Supervision Stand to Sit: 5: Supervision Ambulation/Gait Ambulation/Gait Assistance: 5: Supervision Ambulation Distance (Feet): 120  Feet Assistive device: None Ambulation/Gait Assistance Details: tolerated well with steady gait , moderate speed, step through pattern, no increase in radicular pain with walking or movement.  Gait Pattern: Within Functional Limits Gait velocity: moderate Stairs: No    Exercises     PT Diagnosis:    PT Problem List:   PT Treatment Interventions:       PT Goals(Current goals can be found in the care plan section) Acute Rehab PT Goals Patient Stated Goal: to be able to head home soon PT Goal Formulation: With patient  Visit Information  Last PT Received On: 01/04/13 Assistance Needed: +1       Prior Functioning  Home Living Family/patient expects to be discharged to:: Private residence Living Arrangements: Spouse/significant other Available Help at Discharge: Family Type of Home: House Home Access: Level entry Home Layout: Able to live on main level with bedroom/bathroom (office upstairs) Home Equipment: None Prior Function Level of Independence: Independent 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

## 2013-01-04 NOTE — Progress Notes (Signed)
CPAP and Mask in car.

## 2013-01-04 NOTE — H&P (View-Only) (Signed)
Brett Schaefer is an 42 y.o. male.   Chief Complaint: back and left leg pain HPI: The patient is a 42 year old male being followed for their back pain. They are now 6 weeks out from most recent flare up. Symptoms reported today include: pain (left buttock), weakness, numbness (left great toe) and leg pain (left). Current treatment includes: physical therapy and Percocet, Robaxin, and Neurontin (improved numbness slightly). The patient reports their current pain level to be mild.  The physical therapy helped his leg pain slightly. It was traction, he is going to convert that to a home program.  PMH GERD Allergic urticaria Nephrolithiasis Sleep apnea   Past Surgical History  Procedure Laterality Date  . Tonsillectomy    nasal septoplasty Sinus surgery  No family history on file. Social History:  reports that he has never smoked. He does not have any smokeless tobacco history on file. He reports that  drinks alcohol. He reports that he does not use illicit drugs. *Former smoker  Allergies:  Allergies  Allergen Reactions  . Aspirin   . Ibuprofen   . Penicillins      (Not in a hospital admission)  No results found for this or any previous visit (from the past 48 hour(s)). No results found.  Review of Systems  Constitutional: Negative.   HENT: Negative.   Eyes: Negative.   Respiratory: Negative.   Cardiovascular: Negative.   Gastrointestinal: Negative.   Genitourinary: Negative.   Musculoskeletal: Positive for back pain.  Skin: Negative.   Neurological: Positive for sensory change and focal weakness.  Endo/Heme/Allergies: Negative.   Psychiatric/Behavioral: Negative.     There were no vitals taken for this visit. Physical Exam  Constitutional: He is oriented to person, place, and time. He appears well-developed and well-nourished. He appears distressed.  HENT:  Head: Normocephalic and atraumatic.  Eyes: Conjunctivae and EOM are normal. Pupils are equal, round, and  reactive to light.  Neck: Normal range of motion. Neck supple.  Cardiovascular: Normal rate and regular rhythm.   Respiratory: Effort normal and breath sounds normal.  GI: Soft. Bowel sounds are normal.  Musculoskeletal:  On exam he is upright in moderate distress. Mood and affect are appropriate. Straight leg raise on the left produces marked buttock, thigh, and calf pain; on the right it is negative. Decreased sensation L5 dermatome on the left compared to the right. EHL is 4/5. He has slightly diminished plantar flexion.  Neurological: He is alert and oriented to person, place, and time. He has normal reflexes.  Skin: Skin is warm and dry.    xrays Lspine Three views demonstrate disc degeneration at 3-4, 4-5, and 5-1. No instability in flexion and extension.  MRI demonstrates predominantly disc herniation at 4-5, compression of the 5 root. There is lateral recess stenosis, multifactorial, at 5-1. Central disc at 3-4.  Assessment/Plan 1. 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 2. He does have radicular pain into the S1 nerve root distribution as well on the left, not at the L4 nerve root distribution. He does have lateral recess stenosis and protrusion there as well. He does not have any neurotension signs or focal neurological deficits on the right.  I had an extensive discussion with the patient concerning current pathology, relevant anatomy and treatment options. He is close to three and a half to four weeks into his symptomatology. He has tried therapy, analgesics,   activity modifications, the presence of a neurocompressive lesion with myotomal weakness and dermatomal dysesthesias not improving, recommend decompression at 4-5. Very well may be appropriate to decompress 5-1 as well. There is disc degeneration and  disc herniation at 3-4, he has no 4 symptoms.  At this point in time the difficult issue is that he does have multilevel disc degeneration. He has predominantly buttock and leg pain. He has had history of back pain and he does have significant disc degeneration at those levels. He has had overwhelming pain generators, the compression of the 5 root and I would recommend micro lumbar decompression of the 5 root. We, however, discussed the risks and benefits.  I had an extensive discussion of the risks and benefits of lumbar decompression with the patient including bleeding, infection, damage to neurovascular structures, epidural fibrosis, CSF leakage requiring repair. We also discussed increase in pain, adjacent segment disease, recurrent disc herniation, need for future surgery including repeat decompression and/or fusion. We also discussed risks of postoperative hematoma, paralysis, anesthetic complications including DVT, PE, death, cardiopulmonary dysfunction. In addition, the perioperative and postoperative courses were discussed in detail including the rehabilitative time and return to functional activity and work. I provided the patient with an illustrated handout and utilized the appropriate surgical models.  Particularly discussed the requirement for an associated fusion. Again, with his three level disease any fusion at 4-5 and 5-1 would require an extension to 3-4 or at least have a significant risk for adjacent segment disease in the near future. Weight reduction, activity modifications would be of benefit as well. I do not feel series of epidurals would be appropriate given the presence of a neurocompressive lesion. I do feel that this should be performed on an urgent basis within a week. However, they are going to obtain a second opinion, and again we are trying to avoid the presence of progressive neurological deficit, foot drop, and etc. I have indicated that if it dramatically worsens  he should call, consider emergent decompression. We will wait to hear from them.  Plan: microlumbar decompression L4-5, L5-S1 left   Mairin Lindsley M. for Dr. Beane 12/19/2012, 1:57 PM    

## 2013-01-04 NOTE — Progress Notes (Signed)
Called Dr Shelle Iron for Pharmacy to verify that he still wants ancef even though patient is allergic to penicillin with rxn of hives.

## 2013-01-04 NOTE — Transfer of Care (Signed)
Immediate Anesthesia Transfer of Care Note  Patient: Brett Schaefer  Procedure(s) Performed: Procedure(s): MICRO LUMBAR DECOMPRESSION L4-S1 LEFT    (2 LEVELS)Discectomy at L4-L5 (Left)  Patient Location: PACU  Anesthesia Type:General  Level of Consciousness: awake, alert  and oriented  Airway & Oxygen Therapy: Patient Spontanous Breathing and Patient connected to face mask oxygen  Post-op Assessment: Report given to PACU RN and Post -op Vital signs reviewed and stable  Post vital signs: Reviewed and stable  Complications: No apparent anesthesia complications

## 2013-01-05 ENCOUNTER — Encounter (HOSPITAL_COMMUNITY): Payer: Self-pay | Admitting: Specialist

## 2013-01-05 NOTE — Care Management Note (Signed)
    Page 1 of 1   01/05/2013     4:58:08 PM   CARE MANAGEMENT NOTE 01/05/2013  Patient:  Brett Schaefer,Brett Schaefer   Account Number:  [id number]  Date Initiated:  01/05/2013  Documentation initiated by:  Colleen Can  Subjective/Objective Assessment:   DX HNp/STENOSIS; MICROLUMBAR DECOMPRESSION l4-s1, DISCECTOMY l4-5     Action/Plan:   DISCHARGED TO HOME   Anticipated DC Date:     Anticipated DC Plan:        DC Planning Services  CM consult      Choice offered to / List presented to:             Status of service:  Completed, signed off Medicare Important Message given?   (If response is "NO", the following Medicare IM given date fields will be blank) Date Medicare IM given:   Date Additional Medicare IM given:    Discharge Disposition:  HOME/SELF CARE  Per UR Regulation:  Reviewed for med. necessity/level of care/duration of stay  If discussed at Long Length of Stay Meetings, dates discussed:    Comments:  01/05/2013 Colleen Can SN RN zCCm [phone] Pt discharged 01/04/2013 on same day of admission. NO HH or DME needs.

## 2013-01-05 NOTE — Discharge Summary (Signed)
Physician Discharge Summary   Patient ID: Brett Schaefer MRN: [id number] DOB/AGE: 41-12-1970 41 y.o.  Admit date: 01/04/2013 Discharge date: 01/05/2013  Primary Diagnosis:   HNP STENOSIS L4-S1   Admission Diagnoses:  Past Medical History  Diagnosis Date  . Heart murmur   . Sleep apnea     cpap , settings at 10  . Kidney stone   . GERD (gastroesophageal reflux disease)    Discharge Diagnoses:   Principal Problem:   HNP (herniated nucleus pulposus), lumbar  Procedure:  Procedure(s) (LRB): MICRO LUMBAR DECOMPRESSION L4-S1 LEFT    (2 LEVELS)Discectomy at L4-L5 (Left)   Consults: None  HPI:  see H&P    Laboratory Data: Hospital Outpatient Visit on 12/30/2012  Component Date Value Range Status  . MRSA, PCR 12/30/2012 NEGATIVE  NEGATIVE Final  . Staphylococcus aureus 12/30/2012 NEGATIVE  NEGATIVE Final   Comment:                                 The Xpert SA Assay (FDA                          approved for NASAL specimens                          in patients over 15 years of age),                          is one component of                          a comprehensive surveillance                          program.  Test performance has                          been validated by [employer] for patients greater                          than or equal to 16 year old.                          It is not intended                          to diagnose infection nor to                          guide or monitor treatment.  . WBC 12/30/2012 6.8  4.0 - 10.5 K/uL Final  . RBC 12/30/2012 5.07  4.22 - 5.81 MIL/uL Final  . Hemoglobin 12/30/2012 15.3  13.0 - 17.0 g/dL Final  . HCT 40/98/1191 43.4  39.0 - 52.0 % Final  . MCV 12/30/2012 85.6  78.0 - 100.0 fL Final  . MCH 12/30/2012 30.2  26.0 - 34.0 pg Final  . MCHC 12/30/2012 35.3  30.0 - 36.0 g/dL Final  . RDW 47/82/9562 12.6  11.5 - 15.5 %  Final  . Platelets 12/30/2012 262  150 - 400 K/uL Final  . Sodium  12/30/2012 141  135 - 145 mEq/L Final  . Potassium 12/30/2012 4.4  3.5 - 5.1 mEq/L Final  . Chloride 12/30/2012 105  96 - 112 mEq/L Final  . CO2 12/30/2012 30  19 - 32 mEq/L Final  . Glucose, Bld 12/30/2012 100* 70 - 99 mg/dL Final  . BUN 47/82/9562 16  6 - 23 mg/dL Final  . Creatinine, Ser 12/30/2012 1.31  0.50 - 1.35 mg/dL Final  . Calcium 13/12/6576 9.6  8.4 - 10.5 mg/dL Final  . GFR calc non Af Amer 12/30/2012 66* >90 mL/min Final  . GFR calc Af Amer 12/30/2012 77* >90 mL/min Final   Comment: (NOTE)                          The eGFR has been calculated using the CKD EPI equation.                          This calculation has not been validated in all clinical situations.                          eGFR's persistently <90 mL/min signify possible Chronic Kidney                          Disease.   No results found for this basename: HGB,  in the last 72 hours No results found for this basename: WBC, RBC, HCT, PLT,  in the last 72 hours No results found for this basename: NA, K, CL, CO2, BUN, CREATININE, GLUCOSE, CALCIUM,  in the last 72 hours No results found for this basename: LABPT, INR,  in the last 72 hours  X-Rays:Dg Lumbar Spine 2-3 Views  12/30/2012   *RADIOLOGY REPORT*  Clinical Data: Low back pain  LUMBAR SPINE - 2-3 VIEW  Comparison: None.  Findings: No fracture or spondylolisthesis is noted.  Minimal disc space narrowing is noted at L5 S1 consistent with degenerative disc disease.  Posterior facet joints appear normal. No soft tissue abnormality is noted.  IMPRESSION: Minimal degenerative disc disease is noted at L5 S1.  No acute abnormality seen in the lumbar spine.   Original Report Authenticated By: Lupita Raider.,  M.D.   Dg Spine Portable 1 View  01/04/2013   *RADIOLOGY REPORT*  Clinical Data: Intraoperative imaging for lumbar decompression.  PORTABLE SPINE - 1 VIEW  Comparison: 01/04/2013 at 1101 hours.  Findings: Single intraoperative cross-table lateral view of the lumbar  spine is submitted.  Numbering system utilized on the prior exam is preserved.  Surgical instrument tips project posterior to the superior endplate of L5 and over the L5-S1 facet joints.  IMPRESSION: Intraoperative localization, as above.   Original Report Authenticated By: Leanna Battles, M.D.   Dg Spine Portable 1 View  01/04/2013   *RADIOLOGY REPORT*  Clinical Data: Lumbar decompression, intraoperative exam.  PORTABLE SPINE - 1 VIEW  Comparison: 01/04/2013 at 1043 hours.  Findings: A single intraoperative cross-table lateral view of the lumbar spine is submitted.  Numbering system utilized on the prior examination is preserved.  Surgical instrument tips project over the L4-5 facet joints and L5-S1 facet joints.  IMPRESSION: Intraoperative localization at L4-5 and L5-S1.   Original Report Authenticated By: Leanna Battles, M.D.   Dg Spine Portable 1 View  01/04/2013   *RADIOLOGY REPORT*  Clinical Data: Intraoperative localization film.  Patient for L4-S1 lumbar surgery.  PORTABLE SPINE - 1 VIEW  Comparison: None.  Findings: Two metallic probes are identified.  One of the probes is at approximately the level of the L5 pedicles.  A second probe is at the level of mid S1.  IMPRESSION: Localization as above.   Original Report Authenticated By: Holley Dexter, M.D.    EKG:No orders found for this or any previous visit.   Hospital Course: Patient was admitted to [facility] and taken to the OR and underwent the above state procedure without complications.  Patient tolerated the procedure well and was later transferred to the recovery room and then to the orthopaedic floor for postoperative care.  They were given PO and IV analgesics for pain control following their surgery.  They were given postoperative antibiotics.   PT was consulted postop to assist with mobility and transfers.  The patient was allowed to be WBAT with therapy and was taught back precautions. Discharge planning was consulted to  help with postop disposition and equipment needs.  Patient reported resolution of his pain and was able to be D/C'd home same day of surgery. They were given discharge instructions and dressing directions.  They were instructed on when to follow up in the office with Dr. Shelle Iron.  Discharge Medications: Prior to Admission medications   Medication Sig Start Date End Date Taking? Authorizing Provider  gabapentin (NEURONTIN) 300 MG capsule Take 300 mg by mouth 3 (three) times daily.   Yes Historical Provider, MD  methocarbamol (ROBAXIN) 500 MG tablet Take 1 tablet (500 mg total) by mouth 4 (four) times daily as needed. 01/04/13   Dayna Barker. Bissell, PA-C  oxyCODONE-acetaminophen (PERCOCET) 7.5-325 MG per tablet Take 1-2 tablets by mouth every 4 (four) hours as needed for pain. 01/04/13   Dayna Barker. Christene Lye, PA-C    Diet: Regular diet Activity:WBAT Follow-up:in 10-14 days Disposition - Home Discharged Condition: good   Discharge Orders   Future Orders Complete By Expires   Call MD / Call 911  As directed    Comments:     If you experience chest pain or shortness of breath, CALL 911 and be transported to the hospital emergency room.  If you develope a fever above 101 F, pus (white drainage) or increased drainage or redness at the wound, or calf pain, call your surgeon's office.   Constipation Prevention  As directed    Comments:     Drink plenty of fluids.  Prune juice may be helpful.  You may use a stool softener, such as Colace (over the counter) 100 mg twice a day.  Use MiraLax (over the counter) for constipation as needed.   Diet - low sodium heart healthy  As directed    Increase activity slowly as tolerated  As directed        Medication List         gabapentin 300 MG capsule  Commonly known as:  NEURONTIN  Take 300 mg by mouth 3 (three) times daily.     methocarbamol 500 MG tablet  Commonly known as:  ROBAXIN  Take 1 tablet (500 mg total) by mouth 4 (four) times daily as needed.      oxyCODONE-acetaminophen 7.5-325 MG per tablet  Commonly known as:  PERCOCET  Take 1-2 tablets by mouth every 4 (four) hours as needed for pain.           Follow-up Information  Follow up with BEANE,Demitrios C, MD In 2 weeks.   Specialty:  Orthopedic Surgery   Contact information:   [address] [place] [address] [phone]       Signed: Dorothy Spark. 01/05/2013, 10:15 AM

## 2013-12-01 ENCOUNTER — Emergency Department (HOSPITAL_COMMUNITY): Payer: BC Managed Care – PPO

## 2013-12-01 ENCOUNTER — Encounter (HOSPITAL_COMMUNITY): Payer: Self-pay | Admitting: Emergency Medicine

## 2013-12-01 ENCOUNTER — Emergency Department (HOSPITAL_COMMUNITY)
Admission: EM | Admit: 2013-12-01 | Discharge: 2013-12-01 | Disposition: A | Discharge status: Home / Self Care | Payer: BC Managed Care – PPO | Attending: Emergency Medicine | Admitting: Emergency Medicine

## 2013-12-01 DIAGNOSIS — Z87442 Personal history of urinary calculi: Secondary | ICD-10-CM | POA: Insufficient documentation

## 2013-12-01 DIAGNOSIS — M5432 Sciatica, left side: Secondary | ICD-10-CM

## 2013-12-01 DIAGNOSIS — M5430 Sciatica, unspecified side: Secondary | ICD-10-CM | POA: Insufficient documentation

## 2013-12-01 DIAGNOSIS — M549 Dorsalgia, unspecified: Secondary | ICD-10-CM

## 2013-12-01 DIAGNOSIS — Z79899 Other long term (current) drug therapy: Secondary | ICD-10-CM | POA: Insufficient documentation

## 2013-12-01 DIAGNOSIS — R110 Nausea: Secondary | ICD-10-CM | POA: Insufficient documentation

## 2013-12-01 DIAGNOSIS — R011 Cardiac murmur, unspecified: Secondary | ICD-10-CM | POA: Insufficient documentation

## 2013-12-01 DIAGNOSIS — Z8719 Personal history of other diseases of the digestive system: Secondary | ICD-10-CM | POA: Insufficient documentation

## 2013-12-01 DIAGNOSIS — H60399 Other infective otitis externa, unspecified ear: Secondary | ICD-10-CM | POA: Insufficient documentation

## 2013-12-01 DIAGNOSIS — H6093 Unspecified otitis externa, bilateral: Secondary | ICD-10-CM

## 2013-12-01 DIAGNOSIS — G4730 Sleep apnea, unspecified: Secondary | ICD-10-CM | POA: Insufficient documentation

## 2013-12-01 DIAGNOSIS — Z9889 Other specified postprocedural states: Secondary | ICD-10-CM | POA: Insufficient documentation

## 2013-12-01 DIAGNOSIS — Z9981 Dependence on supplemental oxygen: Secondary | ICD-10-CM | POA: Insufficient documentation

## 2013-12-01 DIAGNOSIS — R109 Unspecified abdominal pain: Secondary | ICD-10-CM | POA: Insufficient documentation

## 2013-12-01 DIAGNOSIS — Z880 Allergy status to penicillin: Secondary | ICD-10-CM | POA: Insufficient documentation

## 2013-12-01 LAB — URINALYSIS, ROUTINE W REFLEX MICROSCOPIC
Bilirubin Urine: NEGATIVE
Glucose, UA: NEGATIVE mg/dL
Ketones, ur: NEGATIVE mg/dL
Leukocytes, UA: NEGATIVE
Nitrite: NEGATIVE
Protein, ur: NEGATIVE mg/dL
Specific Gravity, Urine: 1.025 (ref 1.005–1.030)
Urobilinogen, UA: 0.2 mg/dL (ref 0.0–1.0)
pH: 5.5 (ref 5.0–8.0)

## 2013-12-01 LAB — COMPREHENSIVE METABOLIC PANEL
ALT: 59 U/L — ABNORMAL HIGH (ref 0–53)
AST: 33 U/L (ref 0–37)
Albumin: 4.5 g/dL (ref 3.5–5.2)
Alkaline Phosphatase: 66 U/L (ref 39–117)
Anion gap: 12 (ref 5–15)
BUN: 13 mg/dL (ref 6–23)
CO2: 27 mEq/L (ref 19–32)
Calcium: 9.8 mg/dL (ref 8.4–10.5)
Chloride: 101 mEq/L (ref 96–112)
Creatinine, Ser: 1.19 mg/dL (ref 0.50–1.35)
GFR calc Af Amer: 86 mL/min — ABNORMAL LOW (ref >90)
GFR calc non Af Amer: 74 mL/min — ABNORMAL LOW (ref >90)
Glucose, Bld: 104 mg/dL — ABNORMAL HIGH (ref 70–99)
Potassium: 3.9 mEq/L (ref 3.7–5.3)
Sodium: 140 mEq/L (ref 137–147)
Total Bilirubin: 0.8 mg/dL (ref 0.3–1.2)
Total Protein: 8.3 g/dL (ref 6.0–8.3)

## 2013-12-01 LAB — CBC WITH DIFFERENTIAL/PLATELET
Basophils Absolute: 0 10*3/uL (ref 0.0–0.1)
Basophils Relative: 0 % (ref 0–1)
Eosinophils Absolute: 0.1 10*3/uL (ref 0.0–0.7)
Eosinophils Relative: 2 % (ref 0–5)
HCT: 44.7 % (ref 39.0–52.0)
Hemoglobin: 16.2 g/dL (ref 13.0–17.0)
Lymphocytes Relative: 20 % (ref 12–46)
Lymphs Abs: 1.4 10*3/uL (ref 0.7–4.0)
MCH: 30.5 pg (ref 26.0–34.0)
MCHC: 36.2 g/dL — ABNORMAL HIGH (ref 30.0–36.0)
MCV: 84.2 fL (ref 78.0–100.0)
Monocytes Absolute: 0.5 10*3/uL (ref 0.1–1.0)
Monocytes Relative: 7 % (ref 3–12)
Neutro Abs: 4.7 10*3/uL (ref 1.7–7.7)
Neutrophils Relative %: 71 % (ref 43–77)
Platelets: 206 10*3/uL (ref 150–400)
RBC: 5.31 MIL/uL (ref 4.22–5.81)
RDW: 12.4 % (ref 11.5–15.5)
WBC: 6.7 10*3/uL (ref 4.0–10.5)

## 2013-12-01 LAB — URINE MICROSCOPIC-ADD ON

## 2013-12-01 LAB — LIPASE, BLOOD: Lipase: 25 U/L (ref 11–59)

## 2013-12-01 MED ORDER — HYDROMORPHONE HCL PF 1 MG/ML IJ SOLN
1.0000 mg | INTRAMUSCULAR | Status: DC | PRN
  Administered 2013-12-01: 1 mg via INTRAVENOUS
  Filled 2013-12-01: qty 1

## 2013-12-01 MED ORDER — ONDANSETRON HCL 4 MG/2ML IJ SOLN
4.0000 mg | Freq: Once | INTRAMUSCULAR | Status: AC
  Administered 2013-12-01: 4 mg via INTRAVENOUS
  Filled 2013-12-01: qty 2

## 2013-12-01 MED ORDER — PREDNISONE 10 MG PO TABS: ORAL_TABLET | ORAL | Status: DC

## 2013-12-01 MED ORDER — DEXAMETHASONE SODIUM PHOSPHATE 10 MG/ML IJ SOLN
10.0000 mg | Freq: Once | INTRAMUSCULAR | Status: AC
  Administered 2013-12-01: 10 mg via INTRAVENOUS
  Filled 2013-12-01: qty 1

## 2013-12-01 MED ORDER — NEOMYCIN-COLIST-HC-THONZONIUM 3.3-3-10-0.5 MG/ML OT SUSP
3.0000 [drp] | Freq: Once | OTIC | Status: AC
  Administered 2013-12-01: 3 [drp] via OTIC
  Filled 2013-12-01: qty 5

## 2013-12-01 MED ORDER — NEOMYCIN-POLYMYXIN-HC 3.5-10000-1 OT SUSP: 3.0000 [drp] | Freq: Three times a day (TID) | OTIC | Status: DC

## 2013-12-01 NOTE — ED Notes (Signed)
Pt reports in Aug 2014 had back surgery. Reports chronic left sided sided back pain that radiates down his leg. 6 months ago reruptured back. For last month has had increased back pain. Has been going to orthopedic doctor and now sees pain management doctor. Has nerve block scheduled for 7/28. Has not taken prednisone or antiinflammatory x2 weeks because of nerve block. Pain management doctor put pt on hydromorphone which he states is not working. Reports in past was on oxycodone. Wife reports she texted doctor who said to go back to the oxycodone, but pt does not have enough medication to last till Tuesday. Pt was at pcp this morning for right ear pain/ swimmers ear. Pt waited 45 minutes but left because of back pain.    Pt has hx of 1 kidney stone. Pain increased this morning and feels "pressure on right side of stomach". Pt unsure if he also has a kidney stone right now. Reports diaphoresis today, denies n/v/d.

## 2013-12-01 NOTE — ED Provider Notes (Signed)
CSN: [id number]     Arrival date & time 12/01/13  1349 History   First MD Initiated Contact with Patient 12/01/13 1428     Chief Complaint  Patient presents with  . Back Pain  . Abdominal Pain      HPI  Patient presents with  Abdominal pain and back/leg pain.  Unfortunate history of prior (right sided) ureteral stone that passed spontaneously.  Developed  lumbar disc disease, and underwent Decompressive surgery with Dr.Bean one year ago. Redeveloped symptoms of his back and leg in December. He was improving with weight loss and physical therapy. However approximately 6 weeks ago developed worsening back and left leg pain. Repeat MRI showed recurrence of disc disease. Planning epidural steroid injection Tuesday. Worsening symptoms bring in the emergency room today. Pain in his left abdomen is different than his typical radicular or sciatic pain. He states that he has more pain with walking but has not had frank foot drop or weakness. No incontinence.   Past Medical History  Diagnosis Date  . Heart murmur   . Sleep apnea     cpap , settings at 10  . Kidney stone   . GERD (gastroesophageal reflux disease)    Past Surgical History  Procedure Laterality Date  . Tonsillectomy    . Lumbar laminectomy/decompression microdiscectomy Left 01/04/2013    Procedure: MICRO LUMBAR DECOMPRESSION L4-S1 LEFT    (2 LEVELS)Discectomy at L4-L5;  Surgeon: Johnn Hai, MD;  Location: [facility];  Service: Orthopedics;  Laterality: Left;   History reviewed. No pertinent family history. History  Substance Use Topics  . Smoking status: Never Smoker   . Smokeless tobacco: Never Used  . Alcohol Use: Yes     Comment: occasional    Review of Systems  Constitutional: Negative for fever, chills, diaphoresis, appetite change and fatigue.  HENT: Negative for mouth sores, sore throat and trouble swallowing.   Eyes: Negative for visual disturbance.  Respiratory: Negative for cough, chest tightness, shortness of  breath and wheezing.   Cardiovascular: Negative for chest pain.  Gastrointestinal: Positive for nausea. Negative for vomiting, abdominal pain, diarrhea and abdominal distention.  Endocrine: Negative for polydipsia, polyphagia and polyuria.  Genitourinary: Negative for dysuria, frequency, hematuria and flank pain.  Musculoskeletal: Positive for back pain. Negative for gait problem.  Skin: Negative for color change, pallor and rash.  Neurological: Negative for dizziness, syncope, light-headedness and headaches.  Hematological: Does not bruise/bleed easily.  Psychiatric/Behavioral: Negative for behavioral problems and confusion.      Allergies  Aspirin; Ibuprofen; and Penicillins  Home Medications   Prior to Admission medications   Medication Sig Start Date End Date Taking? Authorizing Provider  gabapentin (NEURONTIN) 300 MG capsule Take 1,200 mg by mouth 3 (three) times daily.   Yes Historical Provider, MD  HYDROmorphone (DILAUDID) 4 MG tablet Take 4 mg by mouth every 4 (four) hours as needed for severe pain.   Yes Historical Provider, MD  Isopropyl Alcohol (SWIMMERS EAR DROPS) 95 % LIQD Place 4 drops into both ears 2 (two) times daily as needed (for ear pain).   Yes Historical Provider, MD  methocarbamol (ROBAXIN) 500 MG tablet Take 500 mg by mouth every 8 (eight) hours as needed for muscle spasms.   Yes Historical Provider, MD  oxyCODONE-acetaminophen (PERCOCET) 10-325 MG per tablet Take 1 tablet by mouth every 6 (six) hours as needed for pain.   Yes Historical Provider, MD  predniSONE (DELTASONE) 10 MG tablet 6PO x2 days, 5 by mouth x2 days,  4 by mouth x2 days, 3 by mouth x2 days, one by mouth x2 days. 12/01/13   Tanna Furry, MD   BP 174/88  Pulse 103  Temp(Src) 97.9 F (36.6 C) (Oral)  Resp 16  SpO2 96% Physical Exam  Constitutional: He is oriented to person, place, and time. He appears well-developed and well-nourished. No distress.  HENT:  Head: Normocephalic.  Eyes:  Conjunctivae are normal. Pupils are equal, round, and reactive to light. No scleral icterus.  Neck: Normal range of motion. Neck supple. No thyromegaly present.  Cardiovascular: Normal rate and regular rhythm.  Exam reveals no gallop and no friction rub.   No murmur heard. Pulmonary/Chest: Effort normal and breath sounds normal. No respiratory distress. He has no wheezes. He has no rales.  Abdominal: Soft. Bowel sounds are normal. He exhibits no distension. There is no tenderness. There is no rebound.  Musculoskeletal: Normal range of motion.       Legs: Neurological: He is alert and oriented to person, place, and time.  Normal symmetric Strength to shoulder shrug, triceps, biceps, grip,wrist flex/extend,and intrinsics  Norma lsymmetric sensation above and below clavicles, and to all distributions to UEs. Norma symmetric strength to flex/.extend hip and knees, dorsi/plantar flex ankles. Normal symmetric sensation to all distributions to LEs Patellar and achilles reflexes 1-2+. Downgoing Babinski   Skin: Skin is warm and dry. No rash noted.  Psychiatric: He has a normal mood and affect. His behavior is normal.    ED Course  Procedures (including critical care time) Labs Review Labs Reviewed  CBC WITH DIFFERENTIAL - Abnormal; Notable for the following:    MCHC 36.2 (*)    All other components within normal limits  COMPREHENSIVE METABOLIC PANEL - Abnormal; Notable for the following:    Glucose, Bld 104 (*)    ALT 59 (*)    GFR calc non Af Amer 74 (*)    GFR calc Af Amer 86 (*)    All other components within normal limits  URINALYSIS, ROUTINE W REFLEX MICROSCOPIC - Abnormal; Notable for the following:    Hgb urine dipstick TRACE (*)    All other components within normal limits  URINE MICROSCOPIC-ADD ON - Abnormal; Notable for the following:    Casts HYALINE CASTS (*)    All other components within normal limits  URINE CULTURE  LIPASE, BLOOD    Imaging Review Ct Abdomen Pelvis  Wo Contrast  12/01/2013   CLINICAL DATA:  Left flank pain  EXAM: CT ABDOMEN AND PELVIS WITHOUT CONTRAST  TECHNIQUE: Multidetector CT imaging of the abdomen and pelvis was performed following the standard protocol without IV contrast.  COMPARISON:  None.  FINDINGS: The lung bases are clear.  There are bilateral nonobstructing nephrolithiasis. There is no ureteral or bladder calculus. No obstructive uropathy. No perinephric stranding is seen. The kidneys are symmetric in size without evidence for exophytic mass. The bladder is unremarkable.  The liver demonstrates no focal abnormality. The gallbladder is unremarkable. The spleen demonstrates no focal abnormality. The adrenal glands and pancreas are normal.  The unopacified stomach, duodenum, small intestine and large intestine are unremarkable, but evaluation is limited by lack of oral contrast. There is a small fat containing inguinal hernia. There is no pneumoperitoneum, pneumatosis, or portal venous gas. There is no abdominal or pelvic free fluid. There is no lymphadenopathy.  The abdominal aorta is normal in caliber.  There is degenerative disc disease and facet arthropathy at L5-S1 with a broad-based disc bulge. There is a broad-based  disc bulge at L4-5.Mild osteoarthritis of bilateral hips. Mild degenerative changes of bilateral SI joints.  IMPRESSION: 1. Nonobstructing bilateral nephrolithiasis.   Electronically Signed   By: Kathreen Devoid   On: 12/01/2013 16:30     EKG Interpretation None      MDM   Final diagnoses:  Left-sided back pain, unspecified location  Sciatica, left  Otitis externa, bilateral    Is moving around on the bed without apparent discomfort with movement. Plan will be to rule out a stone. CT abdomen, urine, pain medication. Reevaluation. No signs of neurological compromise on exam.  CT shows no stone. Some mild constipation. He is using a softener. Plan will be tapering of steroids. Follow through with his ESI on Tuesday.  Return precautions discussed.    Tanna Furry, MD 12/01/13 [phone]

## 2013-12-01 NOTE — Discharge Instructions (Signed)
Back Pain, Adult °Back pain is very common. The pain often gets better over time. The cause of back pain is usually not dangerous. Most people can learn to manage their back pain on their own.  °HOME CARE  °· Stay active. Start with short walks on flat ground if you can. Try to walk farther each day. °· Do not sit, drive, or stand in one place for more than 30 minutes. Do not stay in bed. °· Do not avoid exercise or work. Activity can help your back heal faster. °· Be careful when you bend or lift an object. Bend at your knees, keep the object close to you, and do not twist. °· Sleep on a firm mattress. Lie on your side, and bend your knees. If you lie on your back, put a pillow under your knees. °· Only take medicines as told by your doctor. °· Put ice on the injured area. °¨ Put ice in a plastic bag. °¨ Place a towel between your skin and the bag. °¨ Leave the ice on for 15-20 minutes, 03-04 times a day for the first 2 to 3 days. After that, you can switch between ice and heat packs. °· Ask your doctor about back exercises or massage. °· Avoid feeling anxious or stressed. Find good ways to deal with stress, such as exercise. °GET HELP RIGHT AWAY IF:  °· Your pain does not go away with rest or medicine. °· Your pain does not go away in 1 week. °· You have new problems. °· You do not feel well. °· The pain spreads into your legs. °· You cannot control when you poop (bowel movement) or pee (urinate). °· Your arms or legs feel weak or lose feeling (numbness). °· You feel sick to your stomach (nauseous) or throw up (vomit). °· You have belly (abdominal) pain. °· You feel like you may pass out (faint). °MAKE SURE YOU:  °· Understand these instructions. °· Will watch your condition. °· Will get help right away if you are not doing well or get worse. °Document Released: 10/14/2007 Document Revised: 07/20/2011 Document Reviewed: 08/29/2013 °Exit[employer]® Patient Information ©2015 [employer]. This information is not intended  to replace advice given to you by your health care provider. Make sure you discuss any questions you have with your health care provider. ° °

## 2013-12-01 NOTE — ED Notes (Signed)
MD at bedside. 

## 2013-12-02 LAB — URINE CULTURE
Colony Count: NO GROWTH
Culture: NO GROWTH

## 2013-12-18 ENCOUNTER — Other Ambulatory Visit (HOSPITAL_COMMUNITY): Payer: Self-pay | Admitting: *Deleted

## 2013-12-18 NOTE — H&P (Signed)
Brett Schaefer is an 43 y.o. male.  History of Present Illness Brett Schaefer; 12/18/2013 10:21 AM) The patient is a 43 year old male who comes in today for a preoperative history and physical. The patient is scheduled for a Revision L4-5 left Discectomy to be performed by Dr. Duane Lope D. Rolena Infante, MD at [facility] on 08.13.2015 .   Additional reasons for visit:  Follow-up back is described as the following: The patient is being followed for their re-rupture Left side. back pain (increase in pain over the last 2 months. He had an L5-S1 SNRB by Dr Brett Schaefer on 12/05/13. He states that he went to the [place] ED on 7/24 in severe pain.) and stenosis. They are now 1 year(s) out from surgery (L5-S1 Decompression, Foramiotomy and Lateral Recess Decompression by Dr Brett Schaefer on 12/22/12). Symptoms reported today include: pain, aching, throbbing, leg pain (heavy), pain with lying, pain with lifting, pain with sitting, pain with standing and foot drop (left foot). The patient states that they are doing poorly. Current treatment includes: activity modification and pain medications. The following medication has been used for pain control: Percocet (10/325 qid which is not helping at all) and Nurontin 300mg  #4 tid. The patient reports their current pain level to be 6 / 10.  Allergies Ibuprofen *ANALGESICS - ANTI-INFLAMMATORY* Aspirin *ANALGESICS - NonNarcotic* Penicillin V *PENICILLINS* PENICILLIN 08/29/2008 (Marked as Inactive) ASPIRIN 08/29/2008 (Marked as Inactive)  Family History (Brett Schaefer Schaefer; 12/18/2013 10:22 AM) Heart Disease father, grandfather mothers side and grandfather fathers side Heart disease in male family member before age 58  Past Medical History: GERD Sleep apnea Heart murmur Lumbar discectomy Sinus surgery  Social History Tobacco use Former smoker. never smoker former smoker; smoke(d) less than 1/2 pack(s) per day; uses less than half 1/2 can(s) smokeless per  week Alcohol use current drinker; drinks beer; only occasionally per week current drinker; drinks beer and wine; only occasionally per week Children 3 Current work status working full time Drug/Alcohol Rehab (Currently) no Drug/Alcohol Rehab (Previously) no Exercise Exercises daily; does running / walking Exercises weekly; does team sport Illicit drug use no Living situation live with spouse Marital status married Number of flights of stairs before winded greater than 5 2-3 Pain Contract no Tobacco / smoke exposure no Previously in rehab no Most recent primary occupation Corporate treasurer  Medication History  Percocet (10-325MG  Tablet, 1 (one) Oral PO TID, Taken starting 12/12/2013) Active. (DDB/RCY) Valium (5MG  Tablet, 1 (one) Tablet Oral po TID, Taken starting 12/12/2013) Active. (DDB/RCY) Neurontin (300MG  Capsule, 4 Oral TID, Taken starting 12/06/2013) Active. (jcb/ssj [facility]) Robaxin (500MG  Tablet, 1 (one) Tablet Oral every eight hours, as needed for spasm, Taken starting 11/20/2013) Active. (jcb/jmb/ssj)    Vitals 12/18/2013 10:29 AM Weight: 270 lb Height: 73in Body Surface Area: 2.51 Schaefer Body Mass Index: 35.62 kg/Schaefer Temp.: 98.74F  Pulse: 127 (Regular)  BP: 127/67 (Sitting, Left Arm, Standard)  Review of Systems  Constitutional: Negative.   HENT: Negative.   Eyes: Negative.   Respiratory: Negative.   Cardiovascular: Negative.   Gastrointestinal: Negative.   Genitourinary: Negative.   Musculoskeletal: Positive for back pain.       Left leg radiculopathy   Skin: Negative.   Psychiatric/Behavioral: Negative.     There were no vitals taken for this visit. Physical Exam  Constitutional: He is oriented to person, place, and time. He appears well-developed.  HENT:  Head: Normocephalic and atraumatic.  Eyes: EOM are normal. Pupils are equal, round,  and reactive to light.  Neck: Normal range of motion.  Cardiovascular: Regular rhythm  and normal heart sounds.   tachycardic  Respiratory: Effort normal and breath sounds normal.  Neurological: He is alert and oriented to person, place, and time.  Skin: Skin is warm and dry.     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  Procedure along with possible risks and complications discussed.  All questions answered.  Stressed to patient the importance of being NPO after midnight before his surgery.  Understands risks involved including cancelling surgery and aspiration, etc.    Brett Schaefer 12/18/2013, 10:53 AM

## 2013-12-18 NOTE — Pre-Procedure Instructions (Addendum)
Brett Schaefer  12/18/2013   Your procedure is scheduled on:  Thursday, December 21, 2013 at 7:30 AM.   Report to [facility] Entrance "A" Admitting Office at 5:30 AM.   Call this number if you have problems the morning of surgery: [phone]   Remember:   Do not eat food or drink liquids after midnight Wednesday, 12/20/13.   Take these medicines the morning of surgery with A SIP OF WATER: gabapentin (NEURONTIN), methocarbamol (ROBAXIN), oxyCODONE-acetaminophen (PERCOCET) or HYDROmorphone (DILAUDID) - if needed  Please bring brace with you day of surgery.    Do not wear jewelry.  Do not wear lotions, powders, or cologne. You may wear deodorant.  Men may shave face and neck.  Do not bring valuables to the hospital.  [facility] is not responsible                  for any belongings or valuables.               Contacts, dentures or bridgework may not be worn into surgery.  Leave suitcase in the car. After surgery it may be brought to your room.  For patients admitted to the hospital, discharge time is determined by your                treatment team.               Special Instructions: [name] - Preparing for Surgery  Before surgery, you can play an important role.  Because skin is not sterile, your skin needs to be as free of germs as possible.  You can reduce the number of germs on you skin by washing with CHG (chlorahexidine gluconate) soap before surgery.  CHG is an antiseptic cleaner which kills germs and bonds with the skin to continue killing germs even after washing.  Please DO NOT use if you have an allergy to CHG or antibacterial soaps.  If your skin becomes reddened/irritated stop using the CHG and inform your nurse when you arrive at Short Stay.  Do not shave (including legs and underarms) for at least 48 hours prior to the first CHG shower.  You may shave your face.  Please follow these instructions carefully:   1.  Shower with CHG Soap the night before  surgery and the                                morning of Surgery.  2.  If you choose to wash your hair, wash your hair first as usual with your       normal shampoo.  3.  After you shampoo, rinse your hair and body thoroughly to remove the                      Shampoo.  4.  Use CHG as you would any other liquid soap.  You can apply chg directly       to the skin and wash gently with scrungie or a clean washcloth.  5.  Apply the CHG Soap to your body ONLY FROM THE NECK DOWN.        Do not use on open wounds or open sores.  Avoid contact with your eyes, ears, mouth and genitals (private parts).  Wash genitals (private parts) with your normal soap.  6.  Wash thoroughly, paying special attention to the area where your surgery  will be performed.  7.  Thoroughly rinse your body with warm water from the neck down.  8.  DO NOT shower/wash with your normal soap after using and rinsing off       the CHG Soap.  9.  Pat yourself dry with a clean towel.            10.  Wear clean pajamas.            11.  Place clean sheets on your bed the night of your first shower and do not        sleep with pets.  Day of Surgery  Do not apply any lotions the morning of surgery.  Please wear clean clothes to the hospital/surgery center.     Please read over the following fact sheets that you were given: Pain Booklet, Coughing and Deep Breathing, MRSA Information and Surgical Site Infection Prevention

## 2013-12-19 ENCOUNTER — Encounter (HOSPITAL_COMMUNITY): Payer: Self-pay

## 2013-12-19 ENCOUNTER — Encounter (HOSPITAL_COMMUNITY)
Admission: RE | Admit: 2013-12-19 | Discharge: 2013-12-19 | Disposition: A | Discharge status: Home / Self Care | Payer: BC Managed Care – PPO | Source: Ambulatory Visit | Attending: Orthopedic Surgery | Admitting: Orthopedic Surgery

## 2013-12-19 ENCOUNTER — Other Ambulatory Visit: Payer: Self-pay

## 2013-12-19 DIAGNOSIS — Z880 Allergy status to penicillin: Secondary | ICD-10-CM | POA: Diagnosis not present

## 2013-12-19 DIAGNOSIS — Z886 Allergy status to analgesic agent status: Secondary | ICD-10-CM | POA: Diagnosis not present

## 2013-12-19 DIAGNOSIS — M5126 Other intervertebral disc displacement, lumbar region: Secondary | ICD-10-CM | POA: Diagnosis present

## 2013-12-19 DIAGNOSIS — G4730 Sleep apnea, unspecified: Secondary | ICD-10-CM | POA: Diagnosis not present

## 2013-12-19 DIAGNOSIS — K219 Gastro-esophageal reflux disease without esophagitis: Secondary | ICD-10-CM | POA: Diagnosis not present

## 2013-12-19 DIAGNOSIS — R011 Cardiac murmur, unspecified: Secondary | ICD-10-CM | POA: Diagnosis not present

## 2013-12-19 LAB — SURGICAL PCR SCREEN
MRSA, PCR: NEGATIVE
Staphylococcus aureus: NEGATIVE

## 2013-12-19 LAB — CBC
HCT: 47.2 % (ref 39.0–52.0)
Hemoglobin: 16.6 g/dL (ref 13.0–17.0)
MCH: 30.9 pg (ref 26.0–34.0)
MCHC: 35.2 g/dL (ref 30.0–36.0)
MCV: 87.7 fL (ref 78.0–100.0)
Platelets: 248 10*3/uL (ref 150–400)
RBC: 5.38 MIL/uL (ref 4.22–5.81)
RDW: 12.7 % (ref 11.5–15.5)
WBC: 7.1 10*3/uL (ref 4.0–10.5)

## 2013-12-19 LAB — BASIC METABOLIC PANEL
Anion gap: 12 (ref 5–15)
BUN: 14 mg/dL (ref 6–23)
CO2: 27 mEq/L (ref 19–32)
Calcium: 9.4 mg/dL (ref 8.4–10.5)
Chloride: 103 mEq/L (ref 96–112)
Creatinine, Ser: 1.37 mg/dL — ABNORMAL HIGH (ref 0.50–1.35)
GFR calc Af Amer: 72 mL/min — ABNORMAL LOW (ref >90)
GFR calc non Af Amer: 62 mL/min — ABNORMAL LOW (ref >90)
Glucose, Bld: 108 mg/dL — ABNORMAL HIGH (ref 70–99)
Potassium: 4.1 mEq/L (ref 3.7–5.3)
Sodium: 142 mEq/L (ref 137–147)

## 2013-12-19 NOTE — Progress Notes (Signed)
Anesthesia Note:  Patient is a 43 year old male scheduled for revision L4-5 left discectomy on 12/21/13 by Dr. Rolena Infante.  History includes non-smoker, childhood murmur, GERD, nephrolithiasis, tonsillectomy, L4-5 microdiskectomy and L5-S1 decompression '14. I spoke with him briefly at PAT due to elevated BP without diagnosis HTN.  BP was 133/102 and 148/107, HR was 115.  He reported back and leg pain. He had previous BP readings in Epic showing a BP of 174/88 followed by 126/78 after treatment for his severe back pain.  He took Neurontin and Percocet > 2 hours ago. He has not been previously diagnosed with HTN. PCP is listed as Dr. Orpah Melter.  EKG done at PAT due to tachycardia and elevated BP.  Results showed NSR.   Preoperative labs noted.   I think his elevated BP is related to his pain as his BP was fairly normal following pain treatment on 12/01/13.  He was also wearing an abdominal brace which could also be increasing his pressures to a degree.  He said that he should be able to stop by his PCP office tomorrow for a BP check and further recommendations if needed based on his BP reading. If his BP is not significantly elevated on the day of surgery then I think he would be able to proceed as planned.  He was encouraged to take his pain medication prior to his arrival on the day of surgery.   [facility] Stay Center/Anesthesiology Phone [phone] 12/19/2013 5:40 PM

## 2013-12-20 MED ORDER — VANCOMYCIN HCL 10 G IV SOLR
1500.0000 mg | INTRAVENOUS | Status: AC
  Administered 2013-12-21: 1500 mg via INTRAVENOUS
  Filled 2013-12-20: qty 1500

## 2013-12-20 MED ORDER — DEXAMETHASONE SODIUM PHOSPHATE 4 MG/ML IJ SOLN
10.0000 mg | Freq: Once | INTRAMUSCULAR | Status: AC
  Administered 2013-12-21: 10 mg via INTRAVENOUS
  Filled 2013-12-20: qty 3

## 2013-12-20 MED ORDER — ACETAMINOPHEN 10 MG/ML IV SOLN
1000.0000 mg | Freq: Once | INTRAVENOUS | Status: AC
  Administered 2013-12-21: 1000 mg via INTRAVENOUS
  Filled 2013-12-20: qty 100

## 2013-12-21 ENCOUNTER — Ambulatory Visit (HOSPITAL_COMMUNITY): Payer: BC Managed Care – PPO | Admitting: Certified Registered Nurse Anesthetist

## 2013-12-21 ENCOUNTER — Encounter (HOSPITAL_COMMUNITY): Payer: Self-pay | Admitting: *Deleted

## 2013-12-21 ENCOUNTER — Ambulatory Visit (HOSPITAL_COMMUNITY): Payer: BC Managed Care – PPO

## 2013-12-21 ENCOUNTER — Encounter (HOSPITAL_COMMUNITY)
Admission: RE | Disposition: A | Discharge status: Home / Self Care | Payer: BC Managed Care – PPO | Source: Ambulatory Visit | Attending: Orthopedic Surgery

## 2013-12-21 ENCOUNTER — Observation Stay (HOSPITAL_COMMUNITY)
Admission: RE | Admit: 2013-12-21 | Discharge: 2013-12-22 | Disposition: A | Discharge status: Home / Self Care | Payer: BC Managed Care – PPO | Source: Ambulatory Visit | Attending: Orthopedic Surgery | Admitting: Orthopedic Surgery

## 2013-12-21 ENCOUNTER — Encounter (HOSPITAL_COMMUNITY): Payer: BC Managed Care – PPO | Admitting: Vascular Surgery

## 2013-12-21 DIAGNOSIS — K219 Gastro-esophageal reflux disease without esophagitis: Secondary | ICD-10-CM | POA: Insufficient documentation

## 2013-12-21 DIAGNOSIS — G4730 Sleep apnea, unspecified: Secondary | ICD-10-CM | POA: Insufficient documentation

## 2013-12-21 DIAGNOSIS — Z886 Allergy status to analgesic agent status: Secondary | ICD-10-CM | POA: Insufficient documentation

## 2013-12-21 DIAGNOSIS — M5126 Other intervertebral disc displacement, lumbar region: Principal | ICD-10-CM | POA: Insufficient documentation

## 2013-12-21 DIAGNOSIS — R011 Cardiac murmur, unspecified: Secondary | ICD-10-CM | POA: Insufficient documentation

## 2013-12-21 DIAGNOSIS — Z9889 Other specified postprocedural states: Secondary | ICD-10-CM

## 2013-12-21 DIAGNOSIS — M549 Dorsalgia, unspecified: Secondary | ICD-10-CM | POA: Diagnosis present

## 2013-12-21 DIAGNOSIS — Z880 Allergy status to penicillin: Secondary | ICD-10-CM | POA: Insufficient documentation

## 2013-12-21 HISTORY — PX: LUMBAR LAMINECTOMY/DECOMPRESSION MICRODISCECTOMY: SHX5026

## 2013-12-21 SURGERY — LUMBAR LAMINECTOMY/DECOMPRESSION MICRODISCECTOMY 1 LEVEL
Anesthesia: General | Site: Spine Lumbar | Laterality: Left

## 2013-12-21 MED ORDER — EPHEDRINE SULFATE 50 MG/ML IJ SOLN
INTRAMUSCULAR | Status: AC
  Filled 2013-12-21: qty 1

## 2013-12-21 MED ORDER — NEOSTIGMINE METHYLSULFATE 10 MG/10ML IV SOLN
INTRAVENOUS | Status: AC
  Filled 2013-12-21: qty 3

## 2013-12-21 MED ORDER — HEMOSTATIC AGENTS (NO CHARGE) OPTIME
TOPICAL | Status: DC | PRN
  Administered 2013-12-21: 1 via TOPICAL

## 2013-12-21 MED ORDER — LIDOCAINE HCL (CARDIAC) 20 MG/ML IV SOLN
INTRAVENOUS | Status: DC | PRN
  Administered 2013-12-21: 60 mg via INTRAVENOUS

## 2013-12-21 MED ORDER — SODIUM CHLORIDE 0.9 % IV SOLN: 250.0000 mL | INTRAVENOUS | Status: DC

## 2013-12-21 MED ORDER — SUCCINYLCHOLINE CHLORIDE 20 MG/ML IJ SOLN
INTRAMUSCULAR | Status: AC
  Filled 2013-12-21: qty 1

## 2013-12-21 MED ORDER — SUCCINYLCHOLINE CHLORIDE 20 MG/ML IJ SOLN
INTRAMUSCULAR | Status: DC | PRN
  Administered 2013-12-21: 100 mg via INTRAVENOUS

## 2013-12-21 MED ORDER — OXYCODONE HCL 5 MG PO TABS
10.0000 mg | ORAL_TABLET | ORAL | Status: DC | PRN
  Administered 2013-12-21 – 2013-12-22 (×5): 10 mg via ORAL
  Filled 2013-12-21 (×5): qty 2

## 2013-12-21 MED ORDER — MIDAZOLAM HCL 5 MG/5ML IJ SOLN
INTRAMUSCULAR | Status: DC | PRN
  Administered 2013-12-21: 2 mg via INTRAVENOUS

## 2013-12-21 MED ORDER — ONDANSETRON HCL 4 MG/2ML IJ SOLN
INTRAMUSCULAR | Status: DC | PRN
  Administered 2013-12-21: 4 mg via INTRAVENOUS

## 2013-12-21 MED ORDER — GLYCOPYRROLATE 0.2 MG/ML IJ SOLN
INTRAMUSCULAR | Status: AC
  Filled 2013-12-21: qty 4

## 2013-12-21 MED ORDER — FENTANYL CITRATE 0.05 MG/ML IJ SOLN
INTRAMUSCULAR | Status: DC | PRN
  Administered 2013-12-21 (×5): 50 ug via INTRAVENOUS

## 2013-12-21 MED ORDER — PROPOFOL 10 MG/ML IV BOLUS
INTRAVENOUS | Status: DC | PRN
  Administered 2013-12-21: 160 mg via INTRAVENOUS

## 2013-12-21 MED ORDER — MORPHINE SULFATE 2 MG/ML IJ SOLN
1.0000 mg | INTRAMUSCULAR | Status: DC | PRN
  Administered 2013-12-21: 2 mg via INTRAVENOUS
  Filled 2013-12-21: qty 1

## 2013-12-21 MED ORDER — SODIUM CHLORIDE 0.9 % IJ SOLN: 3.0000 mL | INTRAMUSCULAR | Status: DC | PRN

## 2013-12-21 MED ORDER — ROCURONIUM BROMIDE 100 MG/10ML IV SOLN
INTRAVENOUS | Status: DC | PRN
  Administered 2013-12-21: 40 mg via INTRAVENOUS

## 2013-12-21 MED ORDER — DEXAMETHASONE 4 MG PO TABS
4.0000 mg | ORAL_TABLET | Freq: Four times a day (QID) | ORAL | Status: DC
  Administered 2013-12-21 – 2013-12-22 (×3): 4 mg via ORAL
  Filled 2013-12-21 (×8): qty 1

## 2013-12-21 MED ORDER — 0.9 % SODIUM CHLORIDE (POUR BTL) OPTIME
TOPICAL | Status: DC | PRN
  Administered 2013-12-21: 1000 mL

## 2013-12-21 MED ORDER — GLYCOPYRROLATE 0.2 MG/ML IJ SOLN
INTRAMUSCULAR | Status: DC | PRN
  Administered 2013-12-21: .8 mg via INTRAVENOUS

## 2013-12-21 MED ORDER — BUPIVACAINE-EPINEPHRINE (PF) 0.25% -1:200000 IJ SOLN
INTRAMUSCULAR | Status: AC
  Filled 2013-12-21: qty 30

## 2013-12-21 MED ORDER — DEXAMETHASONE SODIUM PHOSPHATE 4 MG/ML IJ SOLN
4.0000 mg | Freq: Four times a day (QID) | INTRAMUSCULAR | Status: DC
  Administered 2013-12-21: 4 mg via INTRAVENOUS
  Filled 2013-12-21 (×8): qty 1

## 2013-12-21 MED ORDER — PHENYLEPHRINE 40 MCG/ML (10ML) SYRINGE FOR IV PUSH (FOR BLOOD PRESSURE SUPPORT)
PREFILLED_SYRINGE | INTRAVENOUS | Status: AC
  Filled 2013-12-21: qty 10

## 2013-12-21 MED ORDER — DEXAMETHASONE SODIUM PHOSPHATE 10 MG/ML IJ SOLN
INTRAMUSCULAR | Status: AC
  Filled 2013-12-21: qty 1

## 2013-12-21 MED ORDER — THROMBIN 20000 UNITS EX SOLR
CUTANEOUS | Status: AC
  Filled 2013-12-21: qty 20000

## 2013-12-21 MED ORDER — ACETAMINOPHEN 10 MG/ML IV SOLN
1000.0000 mg | Freq: Four times a day (QID) | INTRAVENOUS | Status: DC
  Administered 2013-12-21 (×3): 1000 mg via INTRAVENOUS
  Filled 2013-12-21 (×4): qty 100

## 2013-12-21 MED ORDER — LACTATED RINGERS IV SOLN
INTRAVENOUS | Status: DC | PRN
  Administered 2013-12-21 (×2): via INTRAVENOUS

## 2013-12-21 MED ORDER — NEOMYCIN-POLYMYXIN-HC 3.5-10000-1 OT SUSP
3.0000 [drp] | Freq: Three times a day (TID) | OTIC | Status: DC
  Filled 2013-12-21: qty 10

## 2013-12-21 MED ORDER — MIDAZOLAM HCL 2 MG/2ML IJ SOLN
INTRAMUSCULAR | Status: AC
  Filled 2013-12-21: qty 2

## 2013-12-21 MED ORDER — LIDOCAINE HCL (CARDIAC) 20 MG/ML IV SOLN
INTRAVENOUS | Status: AC
  Filled 2013-12-21: qty 5

## 2013-12-21 MED ORDER — FENTANYL CITRATE 0.05 MG/ML IJ SOLN
INTRAMUSCULAR | Status: AC
  Filled 2013-12-21: qty 5

## 2013-12-21 MED ORDER — PROPOFOL 10 MG/ML IV BOLUS
INTRAVENOUS | Status: AC
  Filled 2013-12-21: qty 20

## 2013-12-21 MED ORDER — DEXTROSE 5 % IV SOLN
500.0000 mg | Freq: Four times a day (QID) | INTRAVENOUS | Status: DC | PRN
  Filled 2013-12-21: qty 5

## 2013-12-21 MED ORDER — METHOCARBAMOL 500 MG PO TABS
500.0000 mg | ORAL_TABLET | Freq: Four times a day (QID) | ORAL | Status: DC | PRN
Start: 2013-12-21 — End: 2013-12-22
  Administered 2013-12-22: 500 mg via ORAL
  Filled 2013-12-21: qty 1

## 2013-12-21 MED ORDER — PHENYLEPHRINE HCL 10 MG/ML IJ SOLN
INTRAMUSCULAR | Status: DC | PRN
  Administered 2013-12-21 (×3): 80 ug via INTRAVENOUS
  Administered 2013-12-21: 40 ug via INTRAVENOUS
  Administered 2013-12-21: 80 ug via INTRAVENOUS
  Administered 2013-12-21: 40 ug via INTRAVENOUS

## 2013-12-21 MED ORDER — ARTIFICIAL TEARS OP OINT
TOPICAL_OINTMENT | OPHTHALMIC | Status: DC | PRN
  Administered 2013-12-21: 1 via OPHTHALMIC

## 2013-12-21 MED ORDER — ROCURONIUM BROMIDE 50 MG/5ML IV SOLN
INTRAVENOUS | Status: AC
  Filled 2013-12-21: qty 1

## 2013-12-21 MED ORDER — VANCOMYCIN HCL IN DEXTROSE 1-5 GM/200ML-% IV SOLN
1000.0000 mg | Freq: Two times a day (BID) | INTRAVENOUS | Status: AC
  Administered 2013-12-21: 1000 mg via INTRAVENOUS
  Filled 2013-12-21: qty 200

## 2013-12-21 MED ORDER — HYDROMORPHONE HCL PF 1 MG/ML IJ SOLN: 0.2500 mg | INTRAMUSCULAR | Status: DC | PRN

## 2013-12-21 MED ORDER — PHENOL 1.4 % MT LIQD: 1.0000 | OROMUCOSAL | Status: DC | PRN

## 2013-12-21 MED ORDER — SODIUM CHLORIDE 0.9 % IJ SOLN: 3.0000 mL | Freq: Two times a day (BID) | INTRAMUSCULAR | Status: DC

## 2013-12-21 MED ORDER — NEOSTIGMINE METHYLSULFATE 10 MG/10ML IV SOLN
INTRAVENOUS | Status: DC | PRN
  Administered 2013-12-21: 5 mg via INTRAVENOUS

## 2013-12-21 MED ORDER — BUPIVACAINE-EPINEPHRINE 0.25% -1:200000 IJ SOLN
INTRAMUSCULAR | Status: DC | PRN
  Administered 2013-12-21: 10 mL

## 2013-12-21 MED ORDER — LACTATED RINGERS IV SOLN
INTRAVENOUS | Status: DC
  Administered 2013-12-21: 13:00:00 via INTRAVENOUS

## 2013-12-21 MED ORDER — MENTHOL 3 MG MT LOZG
1.0000 | LOZENGE | OROMUCOSAL | Status: DC | PRN
Start: 2013-12-21 — End: 2013-12-22

## 2013-12-21 MED ORDER — STERILE WATER FOR INJECTION IJ SOLN
INTRAMUSCULAR | Status: AC
  Filled 2013-12-21: qty 10

## 2013-12-21 MED ORDER — ONDANSETRON HCL 4 MG/2ML IJ SOLN: 4.0000 mg | INTRAMUSCULAR | Status: DC | PRN

## 2013-12-21 MED ORDER — ARTIFICIAL TEARS OP OINT
TOPICAL_OINTMENT | OPHTHALMIC | Status: AC
  Filled 2013-12-21: qty 3.5

## 2013-12-21 MED ORDER — THROMBIN 20000 UNITS EX KIT
PACK | CUTANEOUS | Status: DC | PRN
  Administered 2013-12-21: 09:00:00 via TOPICAL

## 2013-12-21 MED ORDER — ONDANSETRON HCL 4 MG/2ML IJ SOLN
INTRAMUSCULAR | Status: AC
  Filled 2013-12-21: qty 2

## 2013-12-21 MED ORDER — ALBUMIN HUMAN 5 % IV SOLN
INTRAVENOUS | Status: DC | PRN
  Administered 2013-12-21: 09:00:00 via INTRAVENOUS

## 2013-12-21 SURGICAL SUPPLY — 62 items
APL SKNCLS STERI-STRIP NONHPOA (GAUZE/BANDAGES/DRESSINGS) ×1
BENZOIN TINCTURE PRP APPL 2/3 (GAUZE/BANDAGES/DRESSINGS) ×1 IMPLANT
BUR EGG ELITE 4.0 (BURR) IMPLANT
BUR MATCHSTICK NEURO 3.0 LAGG (BURR) IMPLANT
CANISTER SUCTION 2500CC (MISCELLANEOUS) ×2 IMPLANT
CLOSURE STERI-STRIP 1/4X4 (GAUZE/BANDAGES/DRESSINGS) ×1 IMPLANT
CLSR STERI-STRIP ANTIMIC 1/2X4 (GAUZE/BANDAGES/DRESSINGS) ×2 IMPLANT
CORDS BIPOLAR (ELECTRODE) ×2 IMPLANT
COVER SURGICAL LIGHT HANDLE (MISCELLANEOUS) ×2 IMPLANT
DRAIN CHANNEL 15F RND FF W/TCR (WOUND CARE) ×2 IMPLANT
DRAPE POUCH INSTRU U-SHP 10X18 (DRAPES) ×2 IMPLANT
DRAPE SURG 17X23 STRL (DRAPES) ×2 IMPLANT
DRAPE U-SHAPE 47X51 STRL (DRAPES) ×2 IMPLANT
DRSG MEPILEX BORDER 4X8 (GAUZE/BANDAGES/DRESSINGS) ×2 IMPLANT
DURAPREP 26ML APPLICATOR (WOUND CARE) ×2 IMPLANT
ELECT BLADE 4.0 EZ CLEAN MEGAD (MISCELLANEOUS) ×2
ELECT CAUTERY BLADE 6.4 (BLADE) ×2 IMPLANT
ELECT PENCIL ROCKER SW 15FT (MISCELLANEOUS) ×2 IMPLANT
ELECT REM PT RETURN 9FT ADLT (ELECTROSURGICAL) ×2
ELECTRODE BLDE 4.0 EZ CLN MEGD (MISCELLANEOUS) IMPLANT
ELECTRODE REM PT RTRN 9FT ADLT (ELECTROSURGICAL) ×1 IMPLANT
EVACUATOR SILICONE 100CC (DRAIN) ×2 IMPLANT
GLOVE BIOGEL PI IND STRL 8 (GLOVE) ×1 IMPLANT
GLOVE BIOGEL PI IND STRL 8.5 (GLOVE) ×1 IMPLANT
GLOVE BIOGEL PI INDICATOR 8 (GLOVE) ×1
GLOVE BIOGEL PI INDICATOR 8.5 (GLOVE) ×1
GLOVE ECLIPSE 8.5 STRL (GLOVE) ×2 IMPLANT
GLOVE ORTHO TXT STRL SZ7.5 (GLOVE) ×2 IMPLANT
GOWN STRL REUS W/ TWL LRG LVL3 (GOWN DISPOSABLE) ×1 IMPLANT
GOWN STRL REUS W/ TWL XL LVL3 (GOWN DISPOSABLE) ×2 IMPLANT
GOWN STRL REUS W/TWL 2XL LVL3 (GOWN DISPOSABLE) ×4 IMPLANT
GOWN STRL REUS W/TWL LRG LVL3 (GOWN DISPOSABLE) ×4
GOWN STRL REUS W/TWL XL LVL3 (GOWN DISPOSABLE)
KIT BASIN OR (CUSTOM PROCEDURE TRAY) ×2 IMPLANT
KIT ROOM TURNOVER OR (KITS) ×2 IMPLANT
NDL SPNL 18GX3.5 QUINCKE PK (NEEDLE) ×2 IMPLANT
NEEDLE 22X1 1/2 (OR ONLY) (NEEDLE) ×2 IMPLANT
NEEDLE SPNL 18GX3.5 QUINCKE PK (NEEDLE) ×4 IMPLANT
NS IRRIG 1000ML POUR BTL (IV SOLUTION) ×2 IMPLANT
PACK LAMINECTOMY ORTHO (CUSTOM PROCEDURE TRAY) ×2 IMPLANT
PACK UNIVERSAL I (CUSTOM PROCEDURE TRAY) ×2 IMPLANT
PAD ARMBOARD 7.5X6 YLW CONV (MISCELLANEOUS) ×4 IMPLANT
PATTIES SURGICAL .5 X.5 (GAUZE/BANDAGES/DRESSINGS) ×1 IMPLANT
PATTIES SURGICAL .5 X1 (DISPOSABLE) ×2 IMPLANT
SPONGE SURGIFOAM ABS GEL 100 (HEMOSTASIS) ×1 IMPLANT
SURGIFLO TRUKIT (HEMOSTASIS) ×1 IMPLANT
SUT BONE WAX W31G (SUTURE) ×2 IMPLANT
SUT MON AB 3-0 SH 27 (SUTURE) ×2
SUT MON AB 3-0 SH27 (SUTURE) ×1 IMPLANT
SUT VIC AB 0 CT1 27 (SUTURE) ×2
SUT VIC AB 0 CT1 27XBRD ANBCTR (SUTURE) ×1 IMPLANT
SUT VIC AB 1 CT1 18XCR BRD 8 (SUTURE) ×1 IMPLANT
SUT VIC AB 1 CT1 8-18 (SUTURE) ×2
SUT VIC AB 1 CTX 36 (SUTURE) ×4
SUT VIC AB 1 CTX36XBRD ANBCTR (SUTURE) ×2 IMPLANT
SUT VIC AB 2-0 CT1 18 (SUTURE) ×2 IMPLANT
SYR BULB IRRIGATION 50ML (SYRINGE) ×2 IMPLANT
SYR CONTROL 10ML LL (SYRINGE) ×2 IMPLANT
TOWEL OR 17X24 6PK STRL BLUE (TOWEL DISPOSABLE) ×2 IMPLANT
TOWEL OR 17X26 10 PK STRL BLUE (TOWEL DISPOSABLE) ×2 IMPLANT
WATER STERILE IRR 1000ML POUR (IV SOLUTION) ×2 IMPLANT
YANKAUER SUCT BULB TIP NO VENT (SUCTIONS) ×2 IMPLANT

## 2013-12-21 NOTE — Anesthesia Preprocedure Evaluation (Addendum)
Anesthesia Evaluation  Patient identified by MRN, date of birth, ID band Patient awake    Reviewed: Allergy & Precautions, H&P , NPO status , Patient's Chart, lab work & pertinent test results  Airway Mallampati: III TM Distance: >3 FB Neck ROM: Full    Dental  (+) Dental Advisory Given, Teeth Intact   Pulmonary sleep apnea and Continuous Positive Airway Pressure Ventilation ,  breath sounds clear to auscultation        Cardiovascular negative cardio ROS  Rhythm:Regular Rate:Normal     Neuro/Psych    GI/Hepatic GERD-  Medicated,  Endo/Other  Morbid obesity  Renal/GU Renal disease     Musculoskeletal   Abdominal   Peds  Hematology   Anesthesia Other Findings   Reproductive/Obstetrics                        Anesthesia Physical Anesthesia Plan  ASA: III  Anesthesia Plan: General   Post-op Pain Management:    Induction: Intravenous  Airway Management Planned: Oral ETT and Video Laryngoscope Planned  Additional Equipment:   Intra-op Plan:   Post-operative Plan: Extubation in OR  Informed Consent: I have reviewed the patients History and Physical, chart, labs and discussed the procedure including the risks, benefits and alternatives for the proposed anesthesia with the patient or authorized representative who has indicated his/her understanding and acceptance.   Dental advisory given  Plan Discussed with: CRNA, Anesthesiologist and Surgeon  Anesthesia Plan Comments:        Anesthesia Quick Evaluation

## 2013-12-21 NOTE — H&P (Signed)
At this point, given the failure of conservative measures, we have talked about surgery. I have addressed both his and his wife's concerns. They are present for the dictation. We reviewed the wrist which include recurrent disk herniation, ongoing or worse pain, loss or bowel and bladder control and nerve damage, leakage of spinal fluid, need for further surgery such as fusion surgery, ongoing or worse pain including death, stroke or paralysis. All questions were encouraged and answered. We will plan on doing a revision diskectomy which may require slightly more extensive decompression depending upon the extent of the scar. H+P reviewed Left leg pain No change in clinical exam

## 2013-12-21 NOTE — Anesthesia Procedure Notes (Signed)
Procedure Name: Intubation Date/Time: 12/21/2013 7:43 AM Performed by: Jacob Moores Pre-anesthesia Checklist: Patient identified, Emergency Drugs available, Suction available and Patient being monitored Patient Re-evaluated:Patient Re-evaluated prior to inductionOxygen Delivery Method: Circle system utilized Preoxygenation: Pre-oxygenation with 100% oxygen Intubation Type: IV induction and Rapid sequence Ventilation: Two handed mask ventilation required, Mask ventilation with difficulty and Oral airway inserted - appropriate to patient size Grade View: Grade I Tube type: Oral Tube size: 7.5 mm Number of attempts: 1 Airway Equipment and Method: Stylet,  Video-laryngoscopy and Oral airway Placement Confirmation: ETT inserted through vocal cords under direct vision,  positive ETCO2 and breath sounds checked- equal and bilateral Secured at: 23 cm Tube secured with: Tape Dental Injury: Teeth and Oropharynx as per pre-operative assessment

## 2013-12-21 NOTE — Anesthesia Postprocedure Evaluation (Signed)
  Anesthesia Post-op Note  Patient: Brett Schaefer  Procedure(s) Performed: Procedure(s): REVISION L4 - L5 LEFT DISCECTOMY 1 LEVEL (Left)  Patient Location: PACU  Anesthesia Type:General  Level of Consciousness: awake  Airway and Oxygen Therapy: Patient Spontanous Breathing  Post-op Pain: mild  Post-op Assessment: Post-op Vital signs reviewed  Post-op Vital Signs: Reviewed  Last Vitals:  Filed Vitals:   12/21/13 1005  BP: 126/73  Pulse: 88  Temp: 36.9 C  Resp: 14    Complications: No apparent anesthesia complications

## 2013-12-21 NOTE — Brief Op Note (Signed)
12/21/2013  9:45 AM  PATIENT:  Brett Schaefer  43 y.o. male  PRE-OPERATIVE DIAGNOSIS:  recurrent L4 - L5 disc herniation  POST-OPERATIVE DIAGNOSIS:  recurrent L4 - L5 disc herniation  PROCEDURE:  Procedure(s): REVISION L4 - L5 LEFT DISCECTOMY 1 LEVEL (Left)  SURGEON:  Surgeon(s) and Role:    * Melina Schools, MD - Primary  PHYSICIAN ASSISTANT:   ASSISTANTS: Benjiman Core   ANESTHESIA:   general  EBL:  Total I/O In: 1750 [I.V.:1500; IV Piggyback:250] Out: 250 [Blood:250]  BLOOD ADMINISTERED:none  DRAINS: none   LOCAL MEDICATIONS USED:  MARCAINE     SPECIMEN:  No Specimen  DISPOSITION OF SPECIMEN:  N/A  COUNTS:  YES  TOURNIQUET:  * No tourniquets in log *  DICTATION: .Other Dictation: Dictation Number [id number]  PLAN OF CARE: Admit for overnight observation  PATIENT DISPOSITION:  PACU - hemodynamically stable.

## 2013-12-21 NOTE — Transfer of Care (Signed)
Immediate Anesthesia Transfer of Care Note  Patient: Brett Schaefer  Procedure(s) Performed: Procedure(s): REVISION L4 - L5 LEFT DISCECTOMY 1 LEVEL (Left)  Patient Location: PACU  Anesthesia Type:General  Level of Consciousness: awake, alert  and oriented  Airway & Oxygen Therapy: Patient Spontanous Breathing and Patient connected to nasal cannula oxygen  Post-op Assessment: Report given to PACU RN, Post -op Vital signs reviewed and stable and Patient moving all extremities X 4  Post vital signs: Reviewed and stable  Complications: No apparent anesthesia complications

## 2013-12-22 ENCOUNTER — Encounter (HOSPITAL_COMMUNITY): Payer: Self-pay | Admitting: Orthopedic Surgery

## 2013-12-22 DIAGNOSIS — M5126 Other intervertebral disc displacement, lumbar region: Secondary | ICD-10-CM | POA: Diagnosis not present

## 2013-12-22 MED ORDER — DOCUSATE SODIUM 100 MG PO CAPS
100.0000 mg | ORAL_CAPSULE | Freq: Two times a day (BID) | ORAL | Status: DC
Start: 2013-12-22 — End: 2016-04-10

## 2013-12-22 MED ORDER — POLYETHYLENE GLYCOL 3350 17 G PO PACK: 17.0000 g | PACK | Freq: Every day | ORAL | Status: DC

## 2013-12-22 MED ORDER — METHOCARBAMOL 500 MG PO TABS: 500.0000 mg | ORAL_TABLET | Freq: Four times a day (QID) | ORAL | Status: DC | PRN

## 2013-12-22 MED ORDER — OXYCODONE-ACETAMINOPHEN 10-325 MG PO TABS: 1.0000 | ORAL_TABLET | Freq: Four times a day (QID) | ORAL | Status: DC | PRN

## 2013-12-22 MED ORDER — ONDANSETRON 4 MG PO TBDP: 4.0000 mg | ORAL_TABLET | Freq: Three times a day (TID) | ORAL | Status: DC | PRN

## 2013-12-22 NOTE — Op Note (Signed)
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  OPERATIVE PROCEDURE:  Revision lumbar diskectomy at L4-5.  COMPLICATIONS:  None.  CONDITION:  Stable.  FIRST ASSISTANT:  Alyson Locket. Ricard Dillon, [place].  HISTORY:  This is a very pleasant 43 year old gentleman who had a previous lumbar 2 level diskectomy L4-5 and L5-S1 and did well initially.  Unfortunately, over the last 2 months, he has been having recurrent radicular leg pain.  MRI demonstrated recurrent disk herniation.  As a result of the findings, we elected to proceed with revision procedure.  All appropriate risks, benefits, alternatives were discussed with the patient and consent was obtained.  OPERATIVE NOTE:  The patient was brought to the operating room, placed supine on the operating table.  After successful induction of general anesthesia and endotracheal intubation, TEDs, SCDs were applied.  He was turned prone onto the Wilson frame and all bony prominences were well padded.  The back was prepped and draped in a standard fashion.  Time- out was taken to confirm the patient, procedure, and all other pertinent important data.  Once this was done, we confirmed the appropriate level. I infiltrated the incision site with 0.25% Marcaine with epi and then re- incised this previous incision.  Sharp dissection was carried out down to the deep fascia.  The deep fascia was sharply incised and I palpated the remaining spinous process of L4 and L5.  I then used Bovie to remove the paraspinal muscle and then Cobb to resect the scar and inflamed muscle  to expose the 4 and 5 spinous process lamina and facet complex. I placed a Penfield 4 underneath the L4 lamina and then took an x-ray to confirm and that was at the appropriate level.  Once this was done, I then placed my Taylor retractor on the lateral aspect of the facet for exposure.  I then carefully using a nerve curette released the scar from the leading edge of the L5 lamina.  I then used a 2-mm Kerrison and resecting out a portion of the leading edge of the L5 lamina.  I then increased my L4 laminotomy.  At this point, I could now visualize __________ ligamentum flavum.  I then carefully dissected through this until I could see the epidural fat.  Using a 2-mm Kerrison, I began resecting this.  This allowed me to use my Penfield 4 to create a plane between the scar tissue and the thecal sac.  Once this plane was created, I was able to work from the posterolateral aspect into the lateral gutter.  I identified the L5 nerve root.  It was dorsally displaced within the surgical field.  There was also a very adherent scar tissue.  Once I had an increased decompressed area, I was able to start laterally and sweep underneath the nerve  root and thecal sac and began to dissect the disk herniation.  Again, it was noted to be very adherent to the ventral surface of the thecal sac.  I carefully created my plane and then using micropituitary rongeurs began resecting the disk fragments.  Then, using an Epstein curette, I was able to debride the posterior osteophyte that was also causing nerve compression.  Once this was accomplished, I was able to work in the posterior aspect of the disk space and along the anulus to resect as much of the disk __________ without causing any dural tear.  I then proceeded in the lateral gutter superiorly.  At this point, the nerve root was now no longer under tension.  I could freely mobilize it with a nerve hook.  I was able to take a [facility] and pass it  superiorly, inferiorly, laterally, and centrally underneath the thecal sac at the level of the disk space. The disk herniation itself __________ posterior and inferior and I did make sure that I had removed all the fragments of disk material.  At this point, I was pleased with the decompression and diskectomy.  I did find a fragmented disk consistent with what was seen on the preoperative MRI.  At this point, I irrigated the wound copiously with normal saline, and __________ bony edges with bipolar electrocautery.  A thrombin- soaked Gelfoam patty was placed over the exposed thecal sac.  I closed the deep fascia with interrupted #1 Vicryl sutures.  Once this was done, the superficial tissue was closed with interrupted 2-0 Vicryl sutures, and a 3-0 Monocryl for the skin.  My first assistant was Benjiman Core, [place], who was instrumental in assisting with retraction, visualization, and wound closure.  At the end of the case, all needle and sponge counts were correct.  There was no adverse intraoperative events.     Dahlia Bailiff, MD     DDB/MEDQ  D:  12/21/2013  T:  12/21/2013  Job:  [id number]

## 2013-12-22 NOTE — Evaluation (Signed)
Occupational Therapy Evaluation and Discharge Patient Details Name: Brett Schaefer MRN: [id number] DOB: Apr 23, 1971 Today's Date: 12/22/2013    History of Present Illness pt presents with L4-5    Clinical Impression   This 43 yo male admitted and underwent above presents to acute OT with all education completed with him and his wife. D/C from acute OT.    Follow Up Recommendations  No OT follow up    Equipment Recommendations  None recommended by OT       Precautions / Restrictions Precautions Precautions: Back Precaution Booklet Issued: Yes (comment) Required Braces or Orthoses: Spinal Brace Spinal Brace: Lumbar corset;Applied in standing position Restrictions Weight Bearing Restrictions: No      Mobility Bed Mobility Overal bed mobility: Modified Independent Bed Mobility: Rolling;Sidelying to Sit Rolling: Modified independent (Device/Increase time) Sidelying to sit: Modified independent (Device/Increase time);HOB elevated       General bed mobility comments: cueing for log roll technique and back precautions only.    Transfers Overall transfer level: Modified independent Equipment used: None Transfers: Sit to/from Stand Sit to Stand: Supervision         General transfer comment: cues for no twisting.  No physical A needed.      Balance Overall balance assessment: Modified Independent                                          ADL Overall ADL's : Modified independent                                       General ADL Comments: Educated pt and wife on LBD, toliet transfer with legs slightly spread and externally rotated, use of wet wipes instead of toliet paper for ease of hygiene, using cups to rinse and spit in for brushing teeth, and using powder in TED hose to help with ease of getting them on.               Pertinent Vitals/Pain Pain Assessment: No/denies pain        Extremity/Trunk Assessment Upper  Extremity Assessment Upper Extremity Assessment: Overall WFL for tasks assessed     Communication Communication Communication: No difficulties   Cognition Arousal/Alertness: Awake/alert Behavior During Therapy: WFL for tasks assessed/performed Overall Cognitive Status: Within Functional Limits for tasks assessed                                Home Living Family/patient expects to be discharged to:: Private residence Living Arrangements: Spouse/significant other Available Help at Discharge: Family;Available PRN/intermittently Type of Home: House Home Access: Stairs to enter [employer] of Steps: 2 Entrance Stairs-Rails: None Home Layout: Two level Alternate Level Stairs-Number of Steps: flight Alternate Level Stairs-Rails: Right           Home Equipment: None          Prior Functioning/Environment Level of Independence: Independent                      OT Goals(Current goals can be found in the care plan section) Acute Rehab OT Goals Patient Stated Goal: Home today  OT Frequency:                End of Session  Equipment Utilized During Treatment: Back brace Nurse Communication:  (Pt ready to go from and OT/PT standpoint)  Activity Tolerance: Patient tolerated treatment well Patient left: with family/visitor present (sitting EOB)   Time: 4970-2637 OT Time Calculation (min): 12 min Charges:  OT General Charges $OT Visit: 1 Procedure OT Evaluation $Initial OT Evaluation Tier I: 1 Procedure OT Treatments $Self Care/Home Management : 8-22 mins G-Codes: OT G-codes **NOT FOR INPATIENT CLASS** Functional Assessment Tool Used: Clinical observation Functional Limitation: Self care Self Care Current Status (C5885): At least 1 percent but less than 20 percent impaired, limited or restricted Self Care Goal Status (O2774): At least 1 percent but less than 20 percent impaired, limited or restricted Self Care Discharge Status [phone]): At  least 1 percent but less than 20 percent impaired, limited or restricted  Almon Register [phone] 12/22/2013, 12:21 PM

## 2013-12-22 NOTE — Progress Notes (Signed)
Subjective: Doing very well.  preop left leg pain is gone.  Has done well with PT  Wants to go home.  No voiding issues.     Objective: Vital signs in last 24 hours: Temp:  [97.6 F (36.4 C)-98.4 F (36.9 C)] 97.7 F (36.5 C) (08/14 0604) Pulse Rate:  [73-108] 82 (08/14 0604) Resp:  [7-16] 16 (08/14 0604) BP: (116-145)/(70-87) 116/72 mmHg (08/14 0604) SpO2:  [92 %-98 %] 97 % (08/14 0604) Weight:  [112.492 kg (248 lb)] 112.492 kg (248 lb) (08/13 1300)  Intake/Output from previous day: 08/13 0701 - 08/14 0700 In: 2510 [P.O.:360; I.V.:1900; IV Piggyback:250] Out: 650 [Urine:400; Blood:250] Intake/Output this shift:     Recent Labs  12/19/13 1028  HGB 16.6    Recent Labs  12/19/13 1028  WBC 7.1  RBC 5.38  HCT 47.2  PLT 248    Recent Labs  12/19/13 1028  NA 142  K 4.1  CL 103  CO2 27  BUN 14  CREATININE 1.37*  GLUCOSE 108*  CALCIUM 9.4   No results found for this basename: LABPT, INR,  in the last 72 hours  Dressing c/d/i.  Neurologically intact.  bilat calves nontender.    Assessment/Plan: D/c home today.  F/u in office 2 weeks.     Brett Schaefer M 12/22/2013, 9:39 AM

## 2013-12-22 NOTE — Care Management Note (Signed)
CARE MANAGEMENT NOTE 12/22/2013  Patient:  Brett Schaefer, Brett Schaefer   Account Number:  [id number]  Date Initiated:  12/22/2013  Documentation initiated by:  Ricki Miller  Subjective/Objective Assessment:   43 yr old male s/p L4-5 disckectomy revision.     Action/Plan:   Per physical therapist evaluation patient has no home health or DME needs. Case manager signing off.   Anticipated DC Date:  12/22/2013   Anticipated DC Plan:  [place]  CM consult      PAC Choice  NA   Choice offered to / List presented to:     DME arranged  NA        HH arranged  NA      Status of service:  Completed, signed off Medicare Important Message given?   (If response is "NO", the following Medicare IM given date fields will be blank) Date Medicare IM given:   Medicare IM given by:   Date Additional Medicare IM given:   Additional Medicare IM given by:    Discharge Disposition:  HOME/SELF CARE  Per UR Regulation:  Reviewed for med. necessity/level of care/duration of stay

## 2013-12-22 NOTE — Evaluation (Signed)
Physical Therapy Evaluation Patient Details Name: Brett Schaefer MRN: [id number] DOB: 1970/12/27 Today's Date: 12/22/2013   History of Present Illness  pt presents with L4-5   Clinical Impression  Pt moving well, without any physical A.  Pt instructed in abdominal isometrics for core stability.  Pt ed on back precautions and good back health.  Also discussed positioning at pt's desk at home and educated on car transfer technique.  Pt would benefit from OPPT once cleared by MD.  No further acute PT needs at this time.  Will sign off.      Follow Up Recommendations No PT follow up;Supervision - Intermittent    Equipment Recommendations  None recommended by PT    Recommendations for Other Services       Precautions / Restrictions Precautions Precautions: Back Precaution Booklet Issued: Yes (comment) Required Braces or Orthoses: Spinal Brace Spinal Brace: Lumbar corset;Applied in standing position Restrictions Weight Bearing Restrictions: No      Mobility  Bed Mobility Overal bed mobility: Needs Assistance Bed Mobility: Rolling;Sidelying to Sit Rolling: Supervision Sidelying to sit: Supervision       General bed mobility comments: cueing for log roll technique and back precautions only.    Transfers Overall transfer level: Needs assistance Equipment used: None Transfers: Sit to/from Stand Sit to Stand: Supervision         General transfer comment: cues for no twisting.  No physical A needed.    Ambulation/Gait Ambulation/Gait assistance: Modified independent (Device/Increase time) Ambulation Distance (Feet): 200 Feet Assistive device: None Gait Pattern/deviations: Step-through pattern;Decreased stride length   Gait velocity interpretation: Below normal speed for age/gender General Gait Details: cues for no twisting whil trying to walk and talk to his wife.    Stairs Stairs: Yes Stairs assistance: Supervision Stair Management: One rail Right;Alternating  pattern;Forwards Number of Stairs: 20 General stair comments: cues to slow down and use railing.    Wheelchair Mobility    Modified Rankin (Stroke Patients Only)       Balance Overall balance assessment: Modified Independent                                           Pertinent Vitals/Pain Pain Assessment: No/denies pain    Home Living Family/patient expects to be discharged to:: Private residence Living Arrangements: Spouse/significant other Available Help at Discharge: Family;Available PRN/intermittently Type of Home: House Home Access: Stairs to enter Entrance Stairs-Rails: None Entrance Stairs-Number of Steps: 2 Home Layout: Two level Home Equipment: None      Prior Function Level of Independence: Independent               Hand Dominance        Extremity/Trunk Assessment   Upper Extremity Assessment: Defer to OT evaluation           Lower Extremity Assessment: Overall WFL for tasks assessed      Cervical / Trunk Assessment: Normal  Communication   Communication: No difficulties  Cognition Arousal/Alertness: Awake/alert Behavior During Therapy: WFL for tasks assessed/performed Overall Cognitive Status: Within Functional Limits for tasks assessed                      General Comments      Exercises        Assessment/Plan    PT Assessment Patent does not need any further PT services  PT Diagnosis  PT Problem List    PT Treatment Interventions     PT Goals (Current goals can be found in the Care Plan section) Acute Rehab PT Goals PT Goal Formulation: No goals set, d/c therapy    Frequency     Barriers to discharge        Co-evaluation               End of Session Equipment Utilized During Treatment: Back brace Activity Tolerance: Patient tolerated treatment well Patient left:  (in room with wife S.  ) Nurse Communication: Mobility status    Functional Assessment Tool Used: Clinical  Judgement Functional Limitation: Mobility: Walking and moving around Mobility: Walking and Moving Around Current Status [phone]): 0 percent impaired, limited or restricted Mobility: Walking and Moving Around Goal Status [phone]): 0 percent impaired, limited or restricted Mobility: Walking and Moving Around Discharge Status [phone]): 0 percent impaired, limited or restricted    Time: 8937-3428 PT Time Calculation (min): 18 min   Charges:   PT Evaluation $Initial PT Evaluation Tier I: 1 Procedure PT Treatments $Gait Training: 8-22 mins   PT G Codes:   Functional Assessment Tool Used: Clinical Judgement Functional Limitation: Mobility: Walking and moving around    BrowningThornton Papas, Brett Schaefer 12/22/2013, 9:38 AM

## 2013-12-22 NOTE — Progress Notes (Signed)
Pt given discharge instructions and scripts; pt verbalized understanding of discharge teaching; Pt ambulated to car with spouse and nurse

## 2013-12-25 MED FILL — Thrombin For Soln 20000 Unit: CUTANEOUS | Qty: 1 | Status: AC

## 2013-12-27 NOTE — Discharge Summary (Signed)
Patient ID: Brett Schaefer MRN: [id number] DOB/AGE: 1970/07/07 43 y.o.  Admit date: 12/21/2013 Discharge date: 12/27/2013  Admission Diagnoses:  Active Problems:   Back pain   Discharge Diagnoses:  Active Problems:   Back pain  status post Procedure(s): REVISION L4 - L5 LEFT DISCECTOMY 1 LEVEL  Past Medical History  Diagnosis Date  . Heart murmur   . Kidney stone   . GERD (gastroesophageal reflux disease)   . Sleep apnea     cpap     Surgeries: Procedure(s): REVISION L4 - L5 LEFT DISCECTOMY 1 LEVEL on 12/21/2013   Consultants:    Discharged Condition: Improved  Hospital Course: Brett Schaefer is an 43 y.o. male who was admitted 12/21/2013 for operative treatment of L4-4 HNP. Patient failed conservative treatments (please see the history and physical for the specifics) and had severe unremitting pain that affects sleep, daily activities and work/hobbies. After pre-op clearance, the patient was taken to the operating room on 12/21/2013 and underwent  Procedure(s): REVISION L4 - L5 LEFT DISCECTOMY 1 LEVEL.    Patient was given perioperative antibiotics:  Anti-infectives   Start     Dose/Rate Route Frequency Ordered Stop   12/21/13 1200  vancomycin (VANCOCIN) IVPB 1000 mg/200 mL premix     1,000 mg 200 mL/hr over 60 Minutes Intravenous Every 12 hours 12/21/13 1153 12/21/13 1452   12/20/13 1440  vancomycin (VANCOCIN) 1,500 mg in sodium chloride 0.9 % 500 mL IVPB     1,500 mg 250 mL/hr over 120 Minutes Intravenous 120 min pre-op 12/20/13 1440 12/21/13 0830       Patient was given sequential compression devices and early ambulation to prevent DVT.   Patient benefited maximally from hospital stay and there were no complications. At the time of discharge, the patient was urinating/moving their bowels without difficulty, tolerating a regular diet, pain is controlled with oral pain medications and they have been cleared by PT/OT.   Recent vital signs: No data  found.    Recent laboratory studies: No results found for this basename: WBC, HGB, HCT, PLT, NA, K, CL, CO2, BUN, CREATININE, GLUCOSE, PT, INR, CALCIUM, 2,  in the last 72 hours   Discharge Medications:     Medication List    STOP taking these medications       HYDROmorphone 4 MG tablet  Commonly known as:  DILAUDID     predniSONE 10 MG tablet  Commonly known as:  DELTASONE     SWIMMERS EAR DROPS 95 % Liqd  Generic drug:  Isopropyl Alcohol      TAKE these medications       docusate sodium 100 MG capsule  Commonly known as:  COLACE  Take 1 capsule (100 mg total) by mouth 2 (two) times daily.     gabapentin 300 MG capsule  Commonly known as:  NEURONTIN  Take 1,200 mg by mouth 3 (three) times daily.     methocarbamol 500 MG tablet  Commonly known as:  ROBAXIN  Take 1 tablet (500 mg total) by mouth every 6 (six) hours as needed for muscle spasms.     neomycin-polymyxin-hydrocortisone 3.5-10000-1 otic suspension  Commonly known as:  CORTISPORIN  Place 3 drops into both ears 3 (three) times daily.     ondansetron 4 MG disintegrating tablet  Commonly known as:  ZOFRAN ODT  Take 1 tablet (4 mg total) by mouth every 8 (eight) hours as needed.     oxyCODONE-acetaminophen 10-325 MG per tablet  Commonly  known as:  PERCOCET  Take 1 tablet by mouth every 6 (six) hours as needed for pain.     polyethylene glycol packet  Commonly known as:  MIRALAX / GLYCOLAX  Take 17 g by mouth daily.        Diagnostic Studies: Ct Abdomen Pelvis Wo Contrast  12/01/2013   CLINICAL DATA:  Left flank pain  EXAM: CT ABDOMEN AND PELVIS WITHOUT CONTRAST  TECHNIQUE: Multidetector CT imaging of the abdomen and pelvis was performed following the standard protocol without IV contrast.  COMPARISON:  None.  FINDINGS: The lung bases are clear.  There are bilateral nonobstructing nephrolithiasis. There is no ureteral or bladder calculus. No obstructive uropathy. No perinephric stranding is seen. The  kidneys are symmetric in size without evidence for exophytic mass. The bladder is unremarkable.  The liver demonstrates no focal abnormality. The gallbladder is unremarkable. The spleen demonstrates no focal abnormality. The adrenal glands and pancreas are normal.  The unopacified stomach, duodenum, small intestine and large intestine are unremarkable, but evaluation is limited by lack of oral contrast. There is a small fat containing inguinal hernia. There is no pneumoperitoneum, pneumatosis, or portal venous gas. There is no abdominal or pelvic free fluid. There is no lymphadenopathy.  The abdominal aorta is normal in caliber.  There is degenerative disc disease and facet arthropathy at L5-S1 with a broad-based disc bulge. There is a broad-based disc bulge at L4-5.Mild osteoarthritis of bilateral hips. Mild degenerative changes of bilateral SI joints.  IMPRESSION: 1. Nonobstructing bilateral nephrolithiasis.   Electronically Signed   By: Kathreen Devoid   On: 12/01/2013 16:30   Dg Lumbar Spine 1 View  12/21/2013   CLINICAL DATA:  Intraoperative localization film prior to L4-5 laminectomy  EXAM: LUMBAR SPINE - 1 VIEW  COMPARISON:  Sagittal and coronal reconstructed images from an abdominal pelvic CT scan of December 01, 2013  FINDINGS: The metallic trocar lies approximately 1 cm posterior to the inferior aspect of the L4-5 disc space.  IMPRESSION: The metallic localization trocar lies 1 cm posterior to the inferior aspect of the L4-L5 disc space.   Electronically Signed   By: David  Martinique   On: 12/21/2013 08:21        Discharge Instructions   Call MD / Call 911    Complete by:  As directed   If you experience chest pain or shortness of breath, CALL 911 and be transported to the hospital emergency room.  If you develope a fever above 101 F, pus (white drainage) or increased drainage or redness at the wound, or calf pain, call your surgeon's office.     Constipation Prevention    Complete by:  As directed    Drink plenty of fluids.  Prune juice may be helpful.  You may use a stool softener, such as Colace (over the counter) 100 mg twice a day.  Use MiraLax (over the counter) for constipation as needed.     Diet - low sodium heart healthy    Complete by:  As directed      Discharge instructions    Complete by:  As directed   Ok to shower 5 days postop.  Do not apply any creams or ointments to incision.  Do not remove steri-strips.  Can use 4x4 gauze and tape for dressing changes.  No aggressive activity.  No bending, squatting or prolonged sitting.  Mostly be in reclined position or lying down.   Must wear brace when up and ambulating.  Driving restrictions    Complete by:  As directed   No driving until further notice.     Increase activity slowly as tolerated    Complete by:  As directed      Lifting restrictions    Complete by:  As directed   No lifting until further notice.           Follow-up Information   Schedule an appointment as soon as possible for a visit with Dahlia Bailiff, MD. (need return office visit 2 weeks postop)    Specialty:  Orthopedic Surgery   Contact information:   [address] [place] [address] [phone]       Discharge Plan:  discharge to home  Disposition:     Signed: Lanae Crumbly for Dr. Melina Schools [facility] [phone] 12/27/2013, 1:25 PM

## 2013-12-27 NOTE — Discharge Summary (Signed)
Agree with above 

## 2014-01-06 NOTE — Addendum Note (Signed)
Addendum created 01/06/14 1520 by Finis Bud, MD   Modules edited: Anesthesia Attestations

## 2014-07-05 ENCOUNTER — Emergency Department (HOSPITAL_BASED_OUTPATIENT_CLINIC_OR_DEPARTMENT_OTHER): Payer: 59

## 2014-07-05 ENCOUNTER — Encounter (HOSPITAL_BASED_OUTPATIENT_CLINIC_OR_DEPARTMENT_OTHER): Payer: Self-pay | Admitting: Emergency Medicine

## 2014-07-05 ENCOUNTER — Emergency Department (HOSPITAL_BASED_OUTPATIENT_CLINIC_OR_DEPARTMENT_OTHER)
Admission: EM | Admit: 2014-07-05 | Discharge: 2014-07-06 | Disposition: A | Discharge status: Home / Self Care | Payer: 59 | Attending: Emergency Medicine | Admitting: Emergency Medicine

## 2014-07-05 DIAGNOSIS — Z8719 Personal history of other diseases of the digestive system: Secondary | ICD-10-CM | POA: Insufficient documentation

## 2014-07-05 DIAGNOSIS — R011 Cardiac murmur, unspecified: Secondary | ICD-10-CM | POA: Insufficient documentation

## 2014-07-05 DIAGNOSIS — R109 Unspecified abdominal pain: Secondary | ICD-10-CM | POA: Diagnosis present

## 2014-07-05 DIAGNOSIS — Z880 Allergy status to penicillin: Secondary | ICD-10-CM | POA: Insufficient documentation

## 2014-07-05 DIAGNOSIS — Z9981 Dependence on supplemental oxygen: Secondary | ICD-10-CM | POA: Diagnosis not present

## 2014-07-05 DIAGNOSIS — N23 Unspecified renal colic: Secondary | ICD-10-CM | POA: Insufficient documentation

## 2014-07-05 DIAGNOSIS — Z79899 Other long term (current) drug therapy: Secondary | ICD-10-CM | POA: Insufficient documentation

## 2014-07-05 DIAGNOSIS — G4730 Sleep apnea, unspecified: Secondary | ICD-10-CM | POA: Insufficient documentation

## 2014-07-05 DIAGNOSIS — Z87442 Personal history of urinary calculi: Secondary | ICD-10-CM | POA: Insufficient documentation

## 2014-07-05 LAB — BASIC METABOLIC PANEL
Anion gap: 4 — ABNORMAL LOW (ref 5–15)
BUN: 20 mg/dL (ref 6–23)
CO2: 28 mmol/L (ref 19–32)
Calcium: 8.8 mg/dL (ref 8.4–10.5)
Chloride: 106 mmol/L (ref 96–112)
Creatinine, Ser: 1.5 mg/dL — ABNORMAL HIGH (ref 0.50–1.35)
GFR calc Af Amer: 65 mL/min — ABNORMAL LOW (ref >90)
GFR calc non Af Amer: 56 mL/min — ABNORMAL LOW (ref >90)
Glucose, Bld: 142 mg/dL — ABNORMAL HIGH (ref 70–99)
Potassium: 3.6 mmol/L (ref 3.5–5.1)
Sodium: 138 mmol/L (ref 135–145)

## 2014-07-05 LAB — URINALYSIS, ROUTINE W REFLEX MICROSCOPIC
Bilirubin Urine: NEGATIVE
Glucose, UA: NEGATIVE mg/dL
Ketones, ur: NEGATIVE mg/dL
Nitrite: NEGATIVE
Protein, ur: 30 mg/dL — AB
Specific Gravity, Urine: 1.025 (ref 1.005–1.030)
Urobilinogen, UA: 0.2 mg/dL (ref 0.0–1.0)
pH: 6.5 (ref 5.0–8.0)

## 2014-07-05 LAB — CBC
HCT: 45.7 % (ref 39.0–52.0)
Hemoglobin: 16.2 g/dL (ref 13.0–17.0)
MCH: 30 pg (ref 26.0–34.0)
MCHC: 35.4 g/dL (ref 30.0–36.0)
MCV: 84.6 fL (ref 78.0–100.0)
Platelets: 258 10*3/uL (ref 150–400)
RBC: 5.4 MIL/uL (ref 4.22–5.81)
RDW: 13 % (ref 11.5–15.5)
WBC: 10.9 10*3/uL — ABNORMAL HIGH (ref 4.0–10.5)

## 2014-07-05 LAB — URINE MICROSCOPIC-ADD ON

## 2014-07-05 MED ORDER — SODIUM CHLORIDE 0.9 % IV SOLN
Freq: Once | INTRAVENOUS | Status: AC
  Administered 2014-07-05: 23:00:00 via INTRAVENOUS

## 2014-07-05 MED ORDER — ONDANSETRON HCL 4 MG/2ML IJ SOLN
4.0000 mg | Freq: Once | INTRAMUSCULAR | Status: AC
  Administered 2014-07-05: 4 mg via INTRAVENOUS
  Filled 2014-07-05: qty 2

## 2014-07-05 MED ORDER — FENTANYL CITRATE 0.05 MG/ML IJ SOLN
100.0000 ug | Freq: Once | INTRAMUSCULAR | Status: AC
  Administered 2014-07-05: 100 ug via INTRAVENOUS
  Filled 2014-07-05: qty 2

## 2014-07-05 NOTE — ED Provider Notes (Signed)
CSN: [id number]     Arrival date & time 07/05/14  2143 History   This chart was scribed for Varney Biles, MD by Evelene Croon, ED Scribe. This patient was seen in room MH12/MH12 and the patient's care was started 11:23 PM.    Chief Complaint  Patient presents with  . Flank Pain      The history is provided by the patient. No language interpreter was used.     HPI Comments:  Brett Schaefer is a 44 y.o. male with a history of kidney stones who presents to the Emergency Department complaining of constant moderate-severe right sided flank pain that radiates into his right lower abdomen and groin that started ~1300/1400 today.  Pt states this episode is similar to his first and only kidney stone 3 years ago which he was able to pass on his own. He reports associated nausea and vomiting. He denies a h/o chronic kidney disease and HTN. No alleviating factors noted.   Past Medical History  Diagnosis Date  . Heart murmur   . Kidney stone   . GERD (gastroesophageal reflux disease)   . Sleep apnea     cpap    Past Surgical History  Procedure Laterality Date  . Tonsillectomy    . Lumbar laminectomy/decompression microdiscectomy Left 01/04/2013    Procedure: MICRO LUMBAR DECOMPRESSION L4-S1 LEFT    (2 LEVELS)Discectomy at L4-L5;  Surgeon: Johnn Hai, MD;  Location: [facility];  Service: Orthopedics;  Laterality: Left;  . Joint replacement    . Lumbar laminectomy/decompression microdiscectomy Left 12/21/2013    Procedure: REVISION L4 - L5 LEFT DISCECTOMY 1 LEVEL;  Surgeon: Melina Schools, MD;  Location: [facility];  Service: Orthopedics;  Laterality: Left;   History reviewed. No pertinent family history. History  Substance Use Topics  . Smoking status: Never Smoker   . Smokeless tobacco: Never Used  . Alcohol Use: Yes     Comment: occasional    Review of Systems  Constitutional: Negative for fever and chills.  Gastrointestinal: Positive for nausea, vomiting and abdominal pain.   Genitourinary: Positive for flank pain.  All other systems reviewed and are negative.     Allergies  Aspirin; Ibuprofen; and Penicillins  Home Medications   Prior to Admission medications   Medication Sig Start Date End Date Taking? Authorizing Provider  docusate sodium (COLACE) 100 MG capsule Take 1 capsule (100 mg total) by mouth 2 (two) times daily. 12/22/13   Benjiman Core, PA-C  gabapentin (NEURONTIN) 300 MG capsule Take 1,200 mg by mouth 3 (three) times daily.    Historical Provider, MD  HYDROcodone-acetaminophen (NORCO/VICODIN) 5-325 MG per tablet Take 1 tablet by mouth every 6 (six) hours as needed. 07/06/14   Varney Biles, MD  methocarbamol (ROBAXIN) 500 MG tablet Take 1 tablet (500 mg total) by mouth every 6 (six) hours as needed for muscle spasms. 12/22/13   Benjiman Core, PA-C  neomycin-polymyxin-hydrocortisone (CORTISPORIN) 3.5-10000-1 otic suspension Place 3 drops into both ears 3 (three) times daily. 12/01/13   Tanna Furry, MD  ondansetron (ZOFRAN ODT) 8 MG disintegrating tablet Take 1 tablet (8 mg total) by mouth every 8 (eight) hours as needed for nausea. 07/06/14   Varney Biles, MD  oxyCODONE-acetaminophen (PERCOCET) 10-325 MG per tablet Take 1 tablet by mouth every 6 (six) hours as needed for pain. 12/22/13   Benjiman Core, PA-C  polyethylene glycol (MIRALAX / GLYCOLAX) packet Take 17 g by mouth daily. 12/22/13   Benjiman Core, PA-C  promethazine (PHENERGAN) 25 MG  suppository Place 1 suppository (25 mg total) rectally every 6 (six) hours as needed for nausea. 07/06/14   Varney Biles, MD  tamsulosin (FLOMAX) 0.4 MG CAPS capsule Take 1 capsule (0.4 mg total) by mouth daily. 07/06/14   Linet Brash, MD   BP 138/80 mmHg  Pulse 67  Temp(Src) 98.9 F (37.2 C) (Oral)  Resp 18  Wt 270 lb (122.471 kg)  SpO2 94% Physical Exam  Constitutional: He is oriented to person, place, and time. He appears well-developed and well-nourished.  HENT:  Head: Normocephalic and atraumatic.   Cardiovascular: Normal rate, regular rhythm and normal heart sounds.   Pulmonary/Chest: Effort normal and breath sounds normal. No respiratory distress.  Abdominal: Soft. He exhibits no distension. There is no tenderness.  Neurological: He is alert and oriented to person, place, and time.  Skin: Skin is warm and dry.  Psychiatric: He has a normal mood and affect.  Nursing note and vitals reviewed.   ED Course  Procedures   DIAGNOSTIC STUDIES:  Oxygen Saturation is 96% on RA, normal by my interpretation.    COORDINATION OF CARE:  11:27 PM Discussed risks and benefits of a abdominal CT. Pt agreed to defer. Will order pain meds and an XR. Discussed treatment plan with pt at bedside and pt agreed to plan.  Labs Review Labs Reviewed  URINALYSIS, ROUTINE W REFLEX MICROSCOPIC - Abnormal; Notable for the following:    APPearance CLOUDY (*)    Hgb urine dipstick LARGE (*)    Protein, ur 30 (*)    Leukocytes, UA TRACE (*)    All other components within normal limits  CBC - Abnormal; Notable for the following:    WBC 10.9 (*)    All other components within normal limits  BASIC METABOLIC PANEL - Abnormal; Notable for the following:    Glucose, Bld 142 (*)    Creatinine, Ser 1.50 (*)    GFR calc non Af Amer 56 (*)    GFR calc Af Amer 65 (*)    Anion gap 4 (*)    All other components within normal limits  URINE MICROSCOPIC-ADD ON    Imaging Review Dg Abd 1 View  07/06/2014   CLINICAL DATA:  Right flank and right lower quadrant pain for 1 day. History of kidney stones.  EXAM: ABDOMEN - 1 VIEW  COMPARISON:  CT 12/01/2013  FINDINGS: The punctate stones in the right kidney on prior CT are not well seen radiographically, single status tentatively identified in the interpolar region. No definite stones along the course of the ureter or in the pelvis. Punctate stone in the interpolar left kidney is again seen. There is moderate stool in the right colon. No dilated bowel loops to suggest  obstruction. No osseous abnormalities.  IMPRESSION: 1. Punctate bilateral renal stones. Additional stones in the right kidney on prior CT are not well seen radiographically. No definite stones along the course of the ureter or in the pelvis. 2. Moderate stool in the right colon.   Electronically Signed   By: Jeb Levering M.D.   On: 07/06/2014 00:33     EKG Interpretation None       @3 :00 am PO challenge passed. Pain in control. Stable for discharge. MDM   Final diagnoses:  Ureteral colic   I personally performed the services described in this documentation, which was scribed in my presence. The recorded information has been reviewed and is accurate.   Pt comes in with cc of flank pain. Pt has hx  of renal stones, and current symptoms are consistent with his previous stones. UA has RBCs. Clinical suspicion high for stones. KUB ordered- and results are as above. PT given analgesics here, and his symptoms improved and responded.  Will d/c.      Varney Biles, MD 07/06/14 [phone]

## 2014-07-05 NOTE — ED Notes (Signed)
ptinet states that he has had right flank pain since about 3pm - dark urine hx of kidney stones

## 2014-07-06 MED ORDER — HYDROCODONE-ACETAMINOPHEN 5-325 MG PO TABS: 1.0000 | ORAL_TABLET | Freq: Four times a day (QID) | ORAL | Status: DC | PRN

## 2014-07-06 MED ORDER — HYDROCODONE-ACETAMINOPHEN 5-325 MG PO TABS
2.0000 | ORAL_TABLET | Freq: Once | ORAL | Status: AC
  Administered 2014-07-06: 2 via ORAL
  Filled 2014-07-06: qty 2

## 2014-07-06 MED ORDER — PROMETHAZINE HCL 25 MG RE SUPP: 25.0000 mg | Freq: Four times a day (QID) | RECTAL | Status: DC | PRN

## 2014-07-06 MED ORDER — ONDANSETRON 8 MG PO TBDP: 8.0000 mg | ORAL_TABLET | Freq: Three times a day (TID) | ORAL | Status: DC | PRN

## 2014-07-06 MED ORDER — HYDROCODONE-ACETAMINOPHEN 5-325 MG PO TABS
ORAL_TABLET | ORAL | Status: AC
  Filled 2014-07-06: qty 1

## 2014-07-06 MED ORDER — TAMSULOSIN HCL 0.4 MG PO CAPS: 0.4000 mg | ORAL_CAPSULE | Freq: Every day | ORAL | Status: DC

## 2014-07-06 MED ORDER — HYDROCODONE-ACETAMINOPHEN 5-325 MG PO TABS
1.0000 | ORAL_TABLET | Freq: Once | ORAL | Status: AC
  Administered 2014-07-06: 1 via ORAL

## 2014-07-06 NOTE — Discharge Instructions (Signed)
We saw you in the ER for the abdominal pain. Our results indicate that you have a kidney stone. We were able to get your pain is relative control, and we can safely send you home.  Take the meds prescribed. Set up an appointment with the Urologist. If the pain is unbearable, you start having fevers, chills, and are unable to keep any meds down - then return to the ER.    Ureteral Colic (Kidney Stones) Ureteral colic is the result of a condition when kidney stones form inside the kidney. Once kidney stones are formed they may move into the tube that connects the kidney with the bladder (ureter). If this occurs, this condition may cause pain (colic) in the ureter.  CAUSES  Pain is caused by stone movement in the ureter and the obstruction caused by the stone. SYMPTOMS  The pain comes and goes as the ureter contracts around the stone. The pain is usually intense, sharp, and stabbing in character. The location of the pain may move as the stone moves through the ureter. When the stone is near the kidney the pain is usually located in the back and radiates to the belly (abdomen). When the stone is ready to pass into the bladder the pain is often located in the lower abdomen on the side the stone is located. At this location, the symptoms may mimic those of a urinary tract infection with urinary frequency. Once the stone is located here it often passes into the bladder and the pain disappears completely. TREATMENT   Your caregiver will provide you with medicine for pain relief.  You may require specialized follow-up X-rays.  The absence of pain does not always mean that the stone has passed. It may have just stopped moving. If the urine remains completely obstructed, it can cause loss of kidney function or even complete destruction of the involved kidney. It is your responsibility and in your interest that X-rays and follow-ups as suggested by your caregiver are completed. Relief of pain without  passage of the stone can be associated with severe damage to the kidney, including loss of kidney function on that side.  If your stone does not pass on its own, additional measures may be taken by your caregiver to ensure its removal. HOME CARE INSTRUCTIONS   Increase your fluid intake. Water is the preferred fluid since juices containing vitamin C may acidify the urine making it less likely for certain stones (uric acid stones) to pass.  Strain all urine. A strainer will be provided. Keep all particulate matter or stones for your caregiver to inspect.  Take your pain medicine as directed.  Make a follow-up appointment with your caregiver as directed.  Remember that the goal is passage of your stone. The absence of pain does not mean the stone is gone. Follow your caregiver's instructions.  Only take over-the-counter or prescription medicines for pain, discomfort, or fever as directed by your caregiver. SEEK MEDICAL CARE IF:   Pain cannot be controlled with the prescribed medicine.  You have a fever.  Pain continues for longer than your caregiver advises it should.  There is a change in the pain, and you develop chest discomfort or constant abdominal pain.  You feel faint or pass out. MAKE SURE YOU:   Understand these instructions.  Will watch your condition.  Will get help right away if you are not doing well or get worse. Document Released: 02/04/2005 Document Revised: 08/22/2012 Document Reviewed: 10/22/2010 Exit[employer] Patient Information 2015 [employer]. This information is not intended to replace advice given to you by your health care provider. Make sure you discuss any questions you have with your health care provider.

## 2015-09-02 DIAGNOSIS — M25562 Pain in left knee: Secondary | ICD-10-CM | POA: Diagnosis not present

## 2016-02-05 IMAGING — CR DG ABDOMEN 1V
2 series · 2 of 2 positions shown · non-contrast
Comparison: CT 12/01/2013

CLINICAL DATA: Right flank and right lower quadrant pain for 1 day.
History of kidney stones.

EXAM:
ABDOMEN - 1 VIEW

[t abdomen supine (1 of 2)]
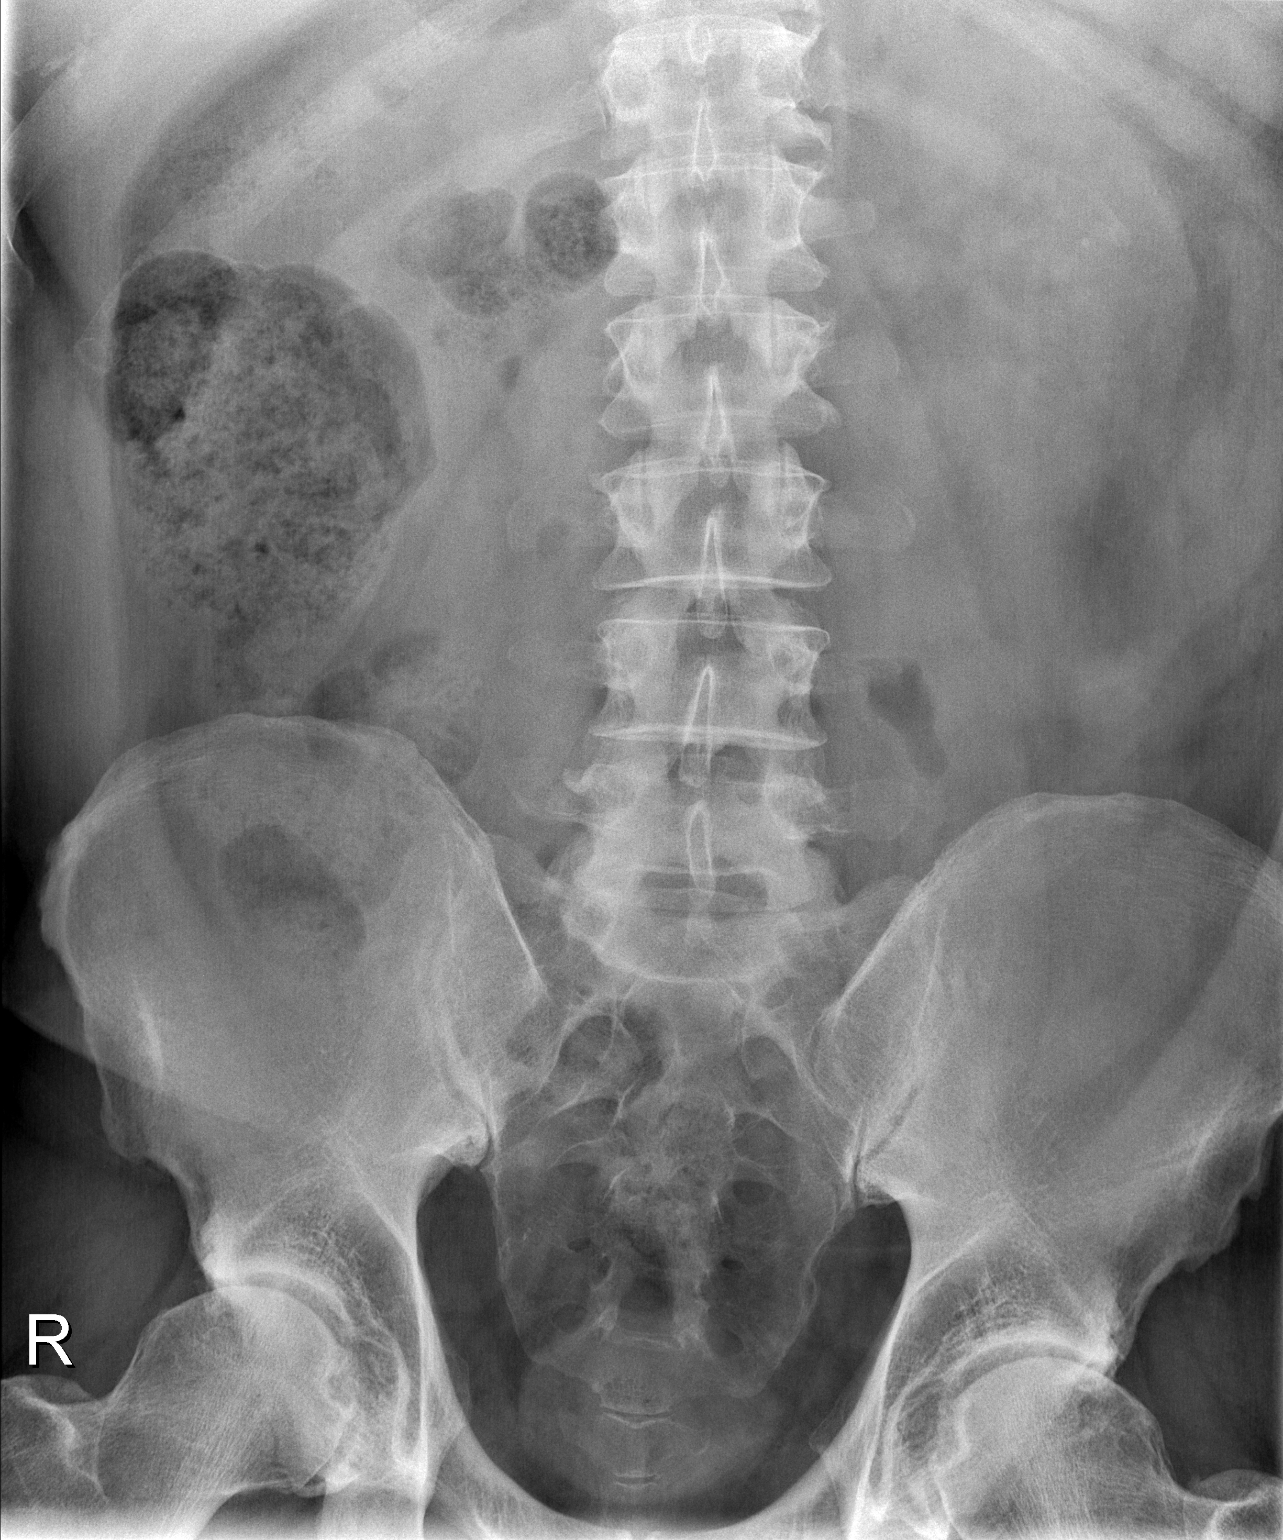

[t abdomen supine (2 of 2)]
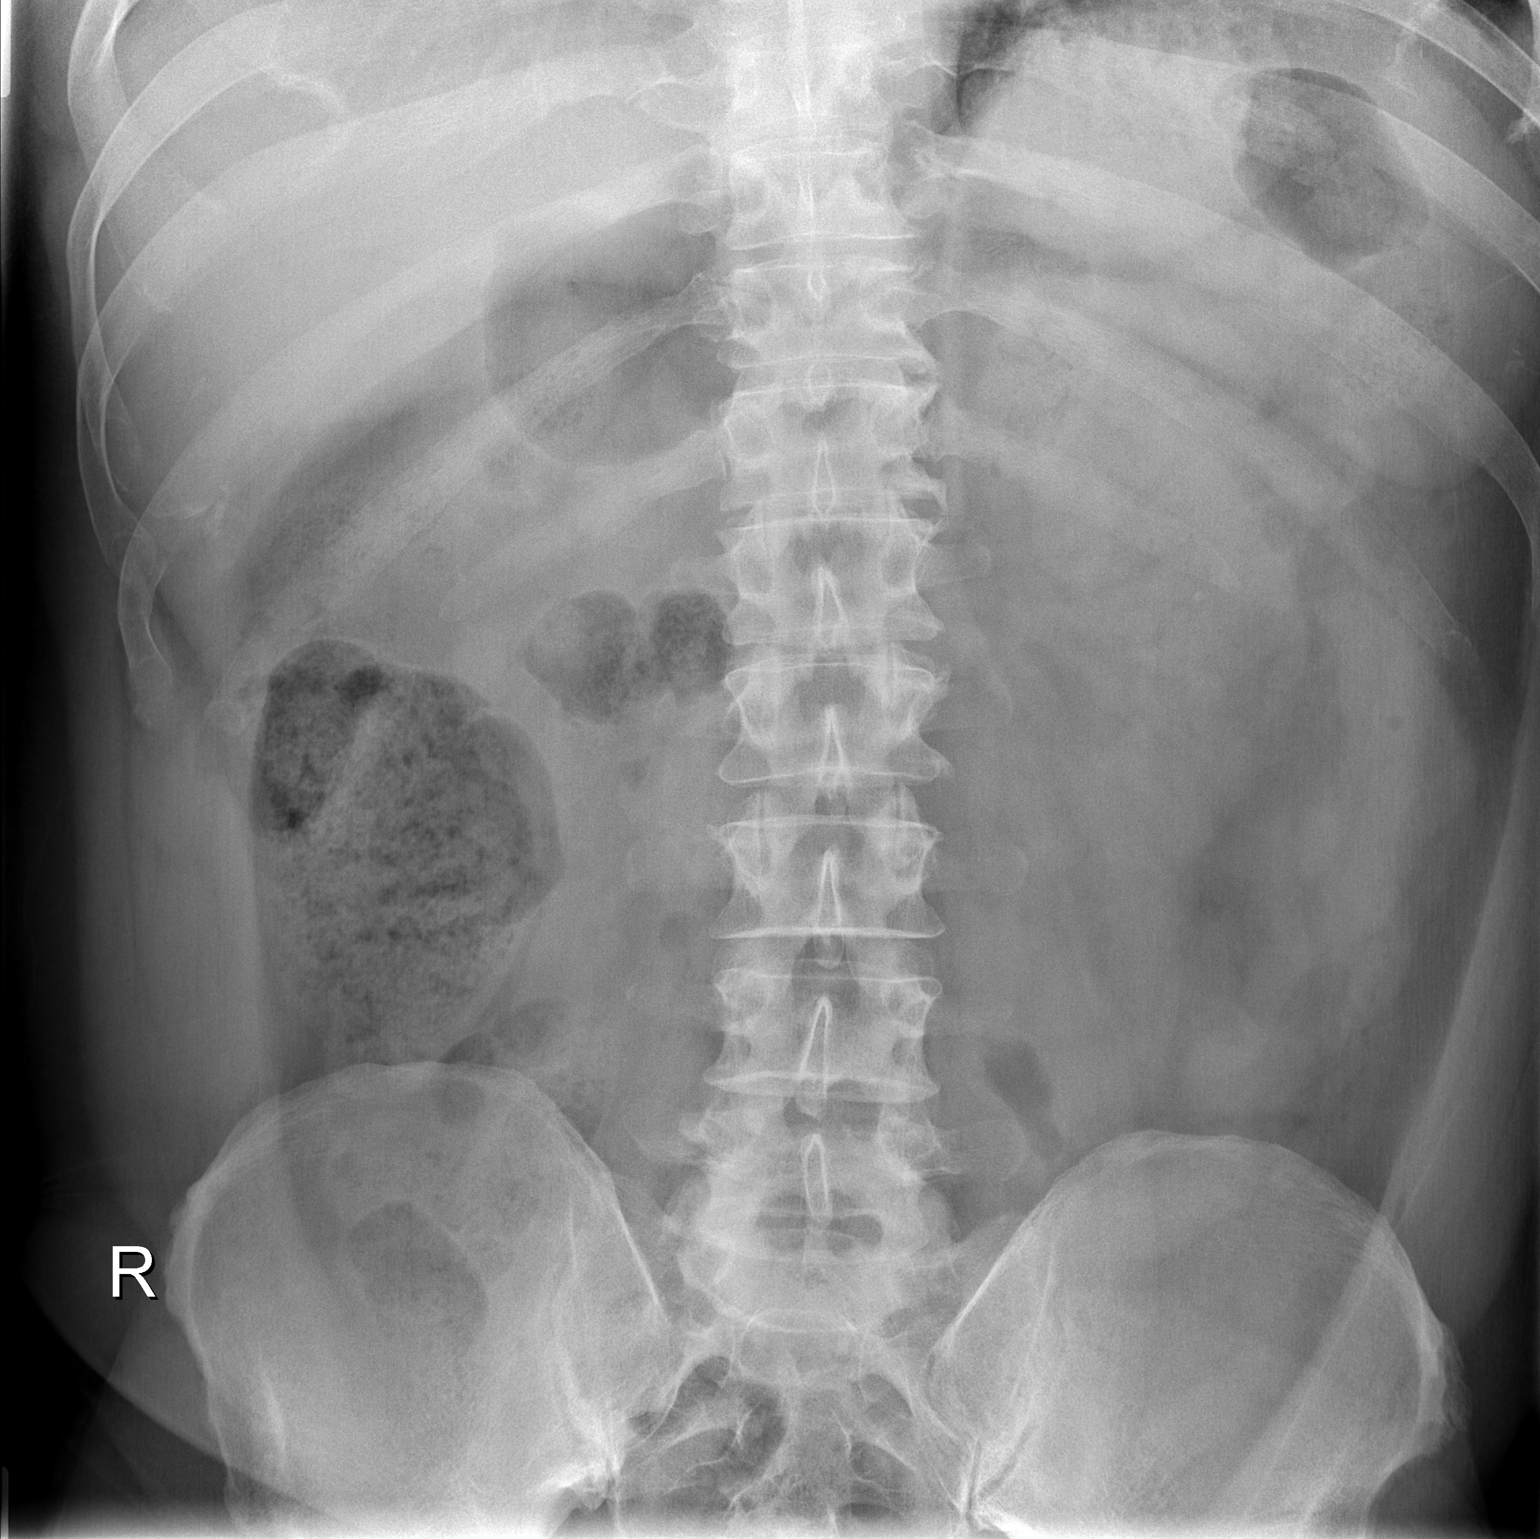

[2 of 2 positions shown; findings below may reference images not displayed]

FINDINGS: The punctate stones in the right kidney on prior CT are not well
seen radiographically, single status tentatively identified in the
interpolar region. No definite stones along the course of the ureter
or in the pelvis. Punctate stone in the interpolar left kidney is
again seen. There is moderate stool in the right colon. No dilated
bowel loops to suggest obstruction. No osseous abnormalities.
IMPRESSION: 1. Punctate bilateral renal stones. Additional stones in the right
kidney on prior CT are not well seen radiographically. No definite
stones along the course of the ureter or in the pelvis.
2. Moderate stool in the right colon.

## 2016-04-10 ENCOUNTER — Encounter: Payer: Self-pay | Admitting: Physician Assistant

## 2016-04-10 ENCOUNTER — Encounter (INDEPENDENT_AMBULATORY_CARE_PROVIDER_SITE_OTHER): Payer: Self-pay

## 2016-04-10 ENCOUNTER — Ambulatory Visit (INDEPENDENT_AMBULATORY_CARE_PROVIDER_SITE_OTHER): Payer: No Typology Code available for payment source | Admitting: Physician Assistant

## 2016-04-10 VITALS — BP 124/70 | HR 85 | Ht 73.0 in | Wt 270.0 lb

## 2016-04-10 DIAGNOSIS — K921 Melena: Secondary | ICD-10-CM | POA: Diagnosis not present

## 2016-04-10 DIAGNOSIS — R1031 Right lower quadrant pain: Secondary | ICD-10-CM | POA: Diagnosis not present

## 2016-04-10 DIAGNOSIS — Z8371 Family history of colonic polyps: Secondary | ICD-10-CM

## 2016-04-10 NOTE — Patient Instructions (Addendum)

## 2016-04-10 NOTE — Progress Notes (Signed)
Chief Complaint: RLQ pain, Hematochezia  HPI:  Mr. Langston is a 45 year old Caucasian male with a past medical history of GERD and kidney stone, who presents clinic today with a complaint of left lower quadrant pain and hematochezia.   Patient presents to clinic today accompanied by his wife. He tells me that for 1-2 years now he has had a right lower quadrant "pressure". He tells me that this is typically a 1 /10 and is constant in nature. It is not made worse or better by anything that he eats or does. Over the past couple of weeks this pressure has increased to a level of 1-1/2 /10. This is along with some maroon-colored blood that the patient has been seeing in his stool once a day for the past week. The patient tells me he has 3 soft to solid bowel movements per day and at least one of them will have this maroon-colored blood streaking throughout it. He denies seeing any bright red blood on the toilet paper or having any rectal pain or history of hemorrhoids. Patient tells me he had a lot " back issues" over the past year for which he has had surgery and thought this right lower quadrant abdominal pain was related to that, but it has not gone away.   Patient does describe some shortness of breath, worse with exertion over the past week, but thinks this is due to some upper respiratory symptoms he has been experiencing. This is not severe.   Patient is currently on Bactrim for his "finger infection".   Patient's family history is significant for a "large number of polyps" in his mother.   Patient denies fever, chills, melena, weight loss, fatigue, anorexia, nausea, vomiting, heartburn, reflux, dysphagia or symptoms that awaken him at night.  Past Medical History:  Diagnosis Date  . GERD (gastroesophageal reflux disease)   . Heart murmur   . Kidney stone   . Sleep apnea    cpap     Past Surgical History:  Procedure Laterality Date  . JOINT REPLACEMENT    . LUMBAR  LAMINECTOMY/DECOMPRESSION MICRODISCECTOMY Left 01/04/2013   Procedure: MICRO LUMBAR DECOMPRESSION L4-S1 LEFT    (2 LEVELS)Discectomy at L4-L5;  Surgeon: Johnn Hai, MD;  Location: [facility];  Service: Orthopedics;  Laterality: Left;  . LUMBAR LAMINECTOMY/DECOMPRESSION MICRODISCECTOMY Left 12/21/2013   Procedure: REVISION L4 - L5 LEFT DISCECTOMY 1 LEVEL;  Surgeon: Melina Schools, MD;  Location: [facility];  Service: Orthopedics;  Laterality: Left;  . TONSILLECTOMY      Current Outpatient Prescriptions  Medication Sig Dispense Refill  . docusate sodium (COLACE) 100 MG capsule Take 1 capsule (100 mg total) by mouth 2 (two) times daily. 50 capsule 0  . gabapentin (NEURONTIN) 300 MG capsule Take 1,200 mg by mouth 3 (three) times daily.    Marland Kitchen HYDROcodone-acetaminophen (NORCO/VICODIN) 5-325 MG per tablet Take 1 tablet by mouth every 6 (six) hours as needed. 15 tablet 0  . methocarbamol (ROBAXIN) 500 MG tablet Take 1 tablet (500 mg total) by mouth every 6 (six) hours as needed for muscle spasms. 60 tablet 0  . neomycin-polymyxin-hydrocortisone (CORTISPORIN) 3.5-10000-1 otic suspension Place 3 drops into both ears 3 (three) times daily. 10 mL 0  . ondansetron (ZOFRAN ODT) 8 MG disintegrating tablet Take 1 tablet (8 mg total) by mouth every 8 (eight) hours as needed for nausea. 20 tablet 0  . oxyCODONE-acetaminophen (PERCOCET) 10-325 MG per tablet Take 1 tablet by mouth every 6 (six) hours as needed for  pain. 90 tablet 0  . polyethylene glycol (MIRALAX / GLYCOLAX) packet Take 17 g by mouth daily. 30 each 0  . promethazine (PHENERGAN) 25 MG suppository Place 1 suppository (25 mg total) rectally every 6 (six) hours as needed for nausea. 12 each 0  . tamsulosin (FLOMAX) 0.4 MG CAPS capsule Take 1 capsule (0.4 mg total) by mouth daily. 10 capsule 0   No current facility-administered medications for this visit.     Allergies as of 04/10/2016 - Review Complete 07/05/2014  Allergen Reaction Noted  . Aspirin  Anaphylaxis 11/22/2010  . Ibuprofen Anaphylaxis 11/22/2010  . Penicillins Hives 11/22/2010    No family history on file.  Social History   Social History  . Marital status: Married    Spouse name: N/A  . Number of children: N/A  . Years of education: N/A   Occupational History  . Not on file.   Social History Main Topics  . Smoking status: Never Smoker  . Smokeless tobacco: Never Used  . Alcohol use Yes     Comment: occasional  . Drug use: No  . Sexual activity: Not on file   Other Topics Concern  . Not on file   Social History Narrative  . No narrative on file    Review of Systems:     Constitutional: No weight loss, fever, chills, weakness or fatigue HEENT: Eyes: No change in vision               Ears, Nose, Throat:  No change in hearing  Skin: No rash Cardiovascular: No chest pain Respiratory: Positive for DOB No cough Gastrointestinal: See HPI and otherwise negative Neurological: No headache, dizziness or syncope Musculoskeletal: No new muscle or joint pain Hematologic: See history of present illness Psychiatric: No history of depression or anxiety   Physical Exam:  Vital signs: BP 124/70   Pulse 85   Ht 6\' 1"  (1.854 m)   Wt 270 lb (122.5 kg)   BMI 35.62 kg/m    Constitutional:   Pleasant Caucasian male appears to be in NAD, Well developed, Well nourished, alert and cooperative Head:  Normocephalic and atraumatic. Eyes:   PEERL, EOMI. No icterus. Conjunctiva pink. Ears:  Normal auditory acuity. Neck:  Supple Throat: Oral cavity and pharynx without inflammation, swelling or lesion.  Respiratory: Respirations even and unlabored. Lungs clear to auscultation bilaterally.   No wheezes, crackles, or rhonchi.  Cardiovascular: Normal S1, S2. No MRG. Regular rate and rhythm. No peripheral edema, cyanosis or pallor.  Gastrointestinal:  Soft, nondistended, very slight right lower quadrant abdominal pain with deep palpation No rebound or guarding. Normal bowel  sounds. No appreciable masses or hepatomegaly. Rectal:  Declined-pt prefers to wait for colonoscopy scheduled Monday next week Msk:  Symmetrical without gross deformities. Without edema, no deformity or joint abnormality.  Neurologic:  Alert and  oriented x4;  grossly normal neurologically.  Skin:   Dry and intact without significant lesions or rashes. Psychiatric: Oriented to person, place and time. Demonstrates good judgement and reason without abnormal affect or behaviors.  NO RECENT LABS OR IMAGING.  Assessment: 1. Right lower quadrant abdominal pain: Chronic over the past 2 years, patient initially thought related to back pain, no change after recent back surgery, 1-2/ 10, constant, no change with activity or bowel movements; consider IBS versus colitis versus other 2. Hematochezia: In 1/3 bowel movements every day for the past week, patient describes as "maroon"; consider AVM versus hemorrhoids versus diverticula versus other 3. Family history of  colon polyps: In his mother, "a lot of polyps" at time of every colonoscopy per his report  Plan: 1. Will request records from [facility]. 2. Recommend the patient proceed with a colonoscopy for further evaluation of hematochezia and right lower quadrant abdominal pain. Discussed risks, benefits, limitations and alternatives and the patient agrees to proceed. This was scheduled next Monday with Dr. Carlean Purl in the [facility]. 3. Patient to follow in clinic per Dr. Celesta Aver recommendations after time of colonoscopy.  Ellouise Newer, PA-C [facility] 04/10/2016, 1:49 PM  Cc: Orpah Melter, MD

## 2016-04-13 ENCOUNTER — Encounter: Payer: Self-pay | Admitting: Internal Medicine

## 2016-04-13 ENCOUNTER — Ambulatory Visit (AMBULATORY_SURGERY_CENTER): Payer: No Typology Code available for payment source | Admitting: Internal Medicine

## 2016-04-13 VITALS — BP 109/71 | HR 91 | Temp 99.3°F | Resp 13 | Ht 73.0 in | Wt 270.0 lb

## 2016-04-13 DIAGNOSIS — K921 Melena: Secondary | ICD-10-CM

## 2016-04-13 DIAGNOSIS — D125 Benign neoplasm of sigmoid colon: Secondary | ICD-10-CM | POA: Diagnosis not present

## 2016-04-13 DIAGNOSIS — D122 Benign neoplasm of ascending colon: Secondary | ICD-10-CM

## 2016-04-13 MED ORDER — SODIUM CHLORIDE 0.9 % IV SOLN: 500.0000 mL | INTRAVENOUS | Status: DC

## 2016-04-13 NOTE — Progress Notes (Signed)
Called to room to assist during endoscopic procedure.  Patient ID and intended procedure confirmed with present staff. Received instructions for my participation in the procedure from the performing physician.  

## 2016-04-13 NOTE — Patient Instructions (Addendum)
I found and removed 3 polyps, one of which was causing the blood in the stool, I think.  I did not see a cause of pain - it could still possibly be nerve in origin and related to back/spine problems.  I appreciate the opportunity to care for you. Gatha Mayer, MD, FACG   YOU HAD AN ENDOSCOPIC PROCEDURE TODAY AT [facility]:   Refer to the procedure report that was given to you for any specific questions about what was found during the examination.  If the procedure report does not answer your questions, please call your gastroenterologist to clarify.  If you requested that your care partner not be given the details of your procedure findings, then the procedure report has been included in a sealed envelope for you to review at your convenience later.  YOU SHOULD EXPECT: Some feelings of bloating in the abdomen. Passage of more gas than usual.  Walking can help get rid of the air that was put into your GI tract during the procedure and reduce the bloating. If you had a lower endoscopy (such as a colonoscopy or flexible sigmoidoscopy) you may notice spotting of blood in your stool or on the toilet paper. If you underwent a bowel prep for your procedure, you may not have a normal bowel movement for a few days.  Please Note:  You might notice some irritation and congestion in your nose or some drainage.  This is from the oxygen used during your procedure.  There is no need for concern and it should clear up in a day or so.  SYMPTOMS TO REPORT IMMEDIATELY:   Following lower endoscopy (colonoscopy or flexible sigmoidoscopy):  Excessive amounts of blood in the stool  Significant tenderness or worsening of abdominal pains  Swelling of the abdomen that is new, acute  Fever of 100F or higher  For urgent or emergent issues, a gastroenterologist can be reached at any hour by calling [phone].   DIET:  We do recommend a small meal at first, but then you may proceed to  your regular diet.  Drink plenty of fluids but you should avoid alcoholic beverages for 24 hours.  ACTIVITY:  You should plan to take it easy for the rest of today and you should NOT DRIVE or use heavy machinery until tomorrow (because of the sedation medicines used during the test).    FOLLOW UP: Our staff will call the number listed on your records the next business day following your procedure to check on you and address any questions or concerns that you may have regarding the information given to you following your procedure. If we do not reach you, we will leave a message.  However, if you are feeling well and you are not experiencing any problems, there is no need to return our call.  We will assume that you have returned to your regular daily activities without incident.  If any biopsies were taken you will be contacted by phone or by letter within the next 1-3 weeks.  Please call us at [phone] if you have not heard about the biopsies in 3 weeks.    SIGNATURES/CONFIDENTIALITY: You and/or your care partner have signed paperwork which will be entered into your electronic medical record.  These signatures attest to the fact that that the information above on your After Visit Summary has been reviewed and is understood.  Full responsibility of the confidentiality of this discharge information lies with you and/or your  care-partner.  No NSAIDS x 2 weeks ie: Aspirin, Aleve, Ibuprofen.   Read all of the handouts given to you by your recovery room nurse.  Thank-you for choosing [place]  For your healthcare needs today.

## 2016-04-13 NOTE — Op Note (Signed)
[facility] Patient Name: Brett Schaefer Procedure Date: 04/13/2016 3:19 PM MRN: [id number] Endoscopist: Gatha Mayer , MD Age: 45 Referring MD:  Date of Birth: 10-Jun-1970 Gender: Male Account #: [id number] Procedure:                Colonoscopy Indications:              Evaluation of unexplained GI bleeding, Hematochezia Medicines:                Propofol per Anesthesia, Monitored Anesthesia Care Procedure:                Pre-Anesthesia Assessment:                           - Prior to the procedure, a History and Physical                            was performed, and patient medications and                            allergies were reviewed. The patient's tolerance of                            previous anesthesia was also reviewed. The risks                            and benefits of the procedure and the sedation                            options and risks were discussed with the patient.                            All questions were answered, and informed consent                            was obtained. Prior Anticoagulants: The patient has                            taken no previous anticoagulant or antiplatelet                            agents. ASA Grade Assessment: II - A patient with                            mild systemic disease. After reviewing the risks                            and benefits, the patient was deemed in                            satisfactory condition to undergo the procedure.                           After obtaining informed consent, the colonoscope  was passed under direct vision. Throughout the                            procedure, the patient's blood pressure, pulse, and                            oxygen saturations were monitored continuously. The                            Model CF-HQ190L [id number]) scope was introduced                            through the anus and advanced to the the cecum,                             identified by appendiceal orifice and ileocecal                            valve. The colonoscopy was performed without                            difficulty. The patient tolerated the procedure                            well. The quality of the bowel preparation was                            good. The ileocecal valve, appendiceal orifice, and                            rectum were photographed. The bowel preparation                            used was Miralax. Scope In: 3:33:16 PM Scope Out: 3:50:25 PM Scope Withdrawal Time: 0 hours 13 minutes 52 seconds  Total Procedure Duration: 0 hours 17 minutes 9 seconds  Findings:                 The perianal and digital rectal examinations were                            normal. Pertinent negatives include normal prostate                            (size, shape, and consistency).                           A 12 mm polyp was found in the sigmoid colon. The                            polyp was pedunculated. The polyp was removed with                            a hot snare. Resection and retrieval were complete.  Verification of patient identification for the                            specimen was done. Estimated blood loss: none.                           A 6 mm polyp was found in the sigmoid colon. The                            polyp was sessile. The polyp was removed with a                            cold snare. Resection and retrieval were complete.                            Verification of patient identification for the                            specimen was done. Estimated blood loss was minimal.                           A 1 to 2 mm polyp was found in the ascending colon.                            The polyp was sessile. The polyp was removed with a                            cold biopsy forceps. Resection and retrieval were                            complete. Verification of patient identification                             for the specimen was done. Estimated blood loss was                            minimal.                           The exam was otherwise without abnormality on                            direct and retroflexion views. Complications:            No immediate complications. Estimated Blood Loss:     Estimated blood loss was minimal. Impression:               - One 12 mm polyp in the sigmoid colon, removed                            with a hot snare. Resected and retrieved.                           - One 6  mm polyp in the sigmoid colon, removed with                            a cold snare. Resected and retrieved.                           - One 1 to 2 mm polyp in the ascending colon,                            removed with a cold biopsy forceps. Resected and                            retrieved.                           - The examination was otherwise normal on direct                            and retroflexion views. Recommendation:           - Patient has a contact number available for                            emergencies. The signs and symptoms of potential                            delayed complications were discussed with the                            patient. Return to normal activities tomorrow.                            Written discharge instructions were provided to the                            patient.                           - Continue present medications.                           - No aspirin, ibuprofen, naproxen, or other                            non-steroidal anti-inflammatory drugs for 2 weeks                            after polyp removal.                           - Repeat colonoscopy is recommended. The                            colonoscopy date will be determined after pathology  results from today's exam become available for                            review.                           - Cause of RLQ pain  not found                           Has had CT 2015 - may still be a result of                            lumboscaral disc problems despite surgery                           - Resume previous diet. Gatha Mayer, MD 04/13/2016 4:09:16 PM This report has been signed electronically.

## 2016-04-13 NOTE — Progress Notes (Signed)
Report to PACU, RN, vss, BBS= Clear.  

## 2016-04-14 ENCOUNTER — Telehealth: Payer: Self-pay | Admitting: *Deleted

## 2016-04-14 ENCOUNTER — Telehealth: Payer: Self-pay

## 2016-04-14 NOTE — Telephone Encounter (Signed)
Name identifier, left message, follow-up 

## 2016-04-14 NOTE — Telephone Encounter (Signed)
  Follow up Call-  Call back number 04/13/2016  Post procedure Call Back phone  # [phone]  Permission to leave phone message Yes  Some recent data might be hidden     Patient questions:  Do you have a fever, pain , or abdominal swelling? No. Pain Score  0 *  Have you tolerated food without any problems? Yes.    Have you been able to return to your normal activities? Yes.    Do you have any questions about your discharge instructions: Diet   No. Medications  No. Follow up visit  No.  Do you have questions or concerns about your Care? No.  Actions: * If pain score is 4 or above: No action needed, pain <4.

## 2016-04-16 ENCOUNTER — Encounter: Payer: Self-pay | Admitting: Internal Medicine

## 2016-04-16 DIAGNOSIS — Z860101 Personal history of adenomatous and serrated colon polyps: Secondary | ICD-10-CM

## 2016-04-16 DIAGNOSIS — Z86010 Personal history of colonic polyps: Secondary | ICD-10-CM

## 2016-04-16 HISTORY — DX: Personal history of adenomatous and serrated colon polyps: Z86.0101

## 2016-04-16 HISTORY — DX: Personal history of colonic polyps: Z86.010

## 2016-04-16 NOTE — Progress Notes (Signed)
3 adenomas max 12 mm Recall 2020

## 2016-04-18 NOTE — Progress Notes (Signed)
Agree with Ms. Lemmon's evaluation and management.  

## 2016-06-05 ENCOUNTER — Ambulatory Visit: Payer: No Typology Code available for payment source | Admitting: Family Medicine

## 2016-06-08 ENCOUNTER — Ambulatory Visit (INDEPENDENT_AMBULATORY_CARE_PROVIDER_SITE_OTHER): Payer: BLUE CROSS/BLUE SHIELD | Admitting: Family Medicine

## 2016-06-08 ENCOUNTER — Encounter: Payer: Self-pay | Admitting: Family Medicine

## 2016-06-08 VITALS — BP 136/87 | HR 77 | Temp 98.4°F | Resp 20 | Ht 73.0 in | Wt 277.0 lb

## 2016-06-08 DIAGNOSIS — Z9989 Dependence on other enabling machines and devices: Secondary | ICD-10-CM | POA: Insufficient documentation

## 2016-06-08 DIAGNOSIS — R0600 Dyspnea, unspecified: Secondary | ICD-10-CM

## 2016-06-08 DIAGNOSIS — Z6836 Body mass index (BMI) 36.0-36.9, adult: Secondary | ICD-10-CM | POA: Diagnosis not present

## 2016-06-08 DIAGNOSIS — Z23 Encounter for immunization: Secondary | ICD-10-CM | POA: Diagnosis not present

## 2016-06-08 DIAGNOSIS — Z0000 Encounter for general adult medical examination without abnormal findings: Secondary | ICD-10-CM

## 2016-06-08 DIAGNOSIS — G4733 Obstructive sleep apnea (adult) (pediatric): Secondary | ICD-10-CM

## 2016-06-08 NOTE — Patient Instructions (Signed)
Your BP on repeat was improved.  Make sure ot eat a low salt diet.  Make an appt for a full physical with fasting labs at your earliest convenience.   I have referred you to a pulmonologist to perform Pulmonary function test and likely order an updated sleep study.   I believe your symptoms are from asthma/OSA .    Please help [place] help you:  It is a privilege to be able to take care of great patients such as yourself. We are honored you have chosen [facility] for your Primary Care home. Below you will find basic instructions that you may need to access in the future. Please help [place] help you by reading the instructions, which cover many of the frequent questions we experience.   Prescription refills and request:  -In order to allow more efficient response time, please call your pharmacy for all refills. They will forward the request electronically to [place]. This allows for the quickest possible response. Request left on a nurse line can take longer to refill, since these are checked as time allows between office patients and other phone calls.  - refill request can take up to 3-5 working days to complete.  - If request is sent electronically and request is appropiate, it is usually completed in 1-2 business days.  - all patients will need to be seen routinely for all chronic medical conditions requiring prescription medications (see follow-up below). If you are overdue for follow up on your condition, you will be asked to make an appointment and we will call in enough medication to cover you until your appointment (up to 30 days).  - all controlled substances will require a face to face visit to request/refill.  - if you desire your prescriptions to go through a new pharmacy, and have an active script at original pharmacy, you will need to call your pharmacy and have scripts transferred to new pharmacy. This is completed between the pharmacy locations and not by your provider.    Results: If  any images or labs were ordered, it can take up to 1 week to get results depending on the test ordered and the lab/facility running and resulting the test. - Normal or stable results, which do not need further discussion, will be released to your mychart immediately with attached note to you. A call will not be generated for normal results. Please make certain to sign up for mychart. If you have questions on how to activate your mychart you can call the front office.  - If your results need further discussion, our office will attempt to contact you via phone, and if unable to reach you after 2 attempts, we will release your abnormal result to your mychart with instructions.  - All results will be automatically released in mychart after 1 week.  - Your provider will provide you with explanation and instruction on all relevant material in your results. Please keep in mind, results and labs may appear confusing or abnormal to the untrained eye, but it does not mean they are actually abnormal for you personally. If you have any questions about your results that are not covered, or you desire more detailed explanation than what was provided, you should make an appointment with your provider to do so.   Our office handles many outgoing and incoming calls daily. If we have not contacted you within 1 week about your results, please check your mychart to see if there is a message first and  if not, then contact our office.  In helping with this matter, you help decrease call volume, and therefore allow [place] to be able to respond to patients needs more efficiently.   Acute office visits (sick visit):  An acute visit is intended for a new problem and are scheduled in shorter time slots to allow schedule openings for patients with new problems. This is the appropriate visit to discuss a new problem. In order to provide you with excellent quality medical care with proper time for you to explain your problem, have an exam  and receive treatment with instructions, these appointments should be limited to one new problem per visit. If you experience a new problem, in which you desire to be addressed, please make an acute office visit, we save openings on the schedule to accommodate you. Please do not save your new problem for any other type of visit, let us take care of it properly and quickly for you.   Follow up visits:  Depending on your condition(s) your provider will need to see you routinely in order to provide you with quality care and prescribe medication(s). Most chronic conditions (Example: hypertension, Diabetes, depression/anxiety... etc), require visits a couple times a year. Your provider will instruct you on proper follow up for your personal medical conditions and history. Please make certain to make follow up appointments for your condition as instructed. Failing to do so could result in lapse in your medication treatment/refills. If you request a refill, and are overdue to be seen on a condition, we will always provide you with a 30 day script (once) to allow you time to schedule.    Medicare wellness (well visit): - we have a wonderful Nurse Maudie Mercury), that will meet with you and provide you will yearly medicare wellness visits. These visits should occur yearly (can not be scheduled less than 1 calendar year apart) and cover preventive health, immunizations, advance directives and screenings you are entitled to yearly through your medicare benefits. Do not miss out on your entitled benefits, this is when medicare will pay for these benefits to be ordered for you.  These are strongly encouraged by your provider and is the appropriate type of visit to make certain you are up to date with all preventive health benefits. If you have not had your medicare wellness exam in the last 12 months, please make certain to schedule one by calling the office and schedule your medicare wellness with Maudie Mercury as soon as possible.    Yearly physical (well visit):  - Adults are recommended to be seen yearly for physicals. Check with your insurance and date of your last physical, most insurances require one calendar year between physicals. Physicals include all preventive health topics, screenings, medical exam and labs that are appropriate for gender/age and history. You may have fasting labs needed at this visit. This is a well visit (not a sick visit), acute topics should not be covered during this visit.  - Pediatric patients are seen more frequently when they are younger. Your provider will advise you on well child visit timing that is appropriate for your their age. - This is not a medicare wellness visit. Medicare wellness exams do not have an exam portion to the visit. Some medicare companies allow for a physical, some do not allow a yearly physical. If your medicare allows a yearly physical you can schedule the medicare wellness with our nurse Maudie Mercury and have your physical with your provider after, on the same day. Please  check with insurance for your full benefits.   Late Policy/No Shows:  - all new patients should arrive 15-30 minutes earlier than appointment to allow [place] time  to  obtain all personal demographics,  insurance information and for you to complete office paperwork. - All established patients should arrive 10-15 minutes earlier than appointment time to update all information and be checked in .  - In our best efforts to run on time, if you are late for your appointment you will be asked to either reschedule or if able, we will work you back into the schedule. There will be a wait time to work you back in the schedule,  depending on availability.  - If you are unable to make it to your appointment as scheduled, please call 24 hours ahead of time to allow [place] to fill the time slot with someone else who needs to be seen. If you do not cancel your appointment ahead of time, you may be charged a no show fee.

## 2016-06-08 NOTE — Progress Notes (Signed)
Patient ID: Brett Schaefer, male  DOB: June 25, 1970, 46 y.o.   MRN: [id number] Patient Care Team    Relationship Specialty Notifications Start End  Ma Hillock, DO PCP - General Family Medicine  06/08/16   Gatha Mayer, MD Consulting Physician Gastroenterology  06/08/16   Susa Day, MD Consulting Physician Orthopedic Surgery  06/08/16     Subjective:  BOWDEN ZILLS is a 46 y.o.  male present for new patient establishment. All past medical history, surgical history, allergies, family history, immunizations, medications and social history were obtained and updated in the electronic medical record today. All recent labs, ED visits and hospitalizations within the last year were reviewed.  Shortness of breath: Patient reports over the last 4 weeks he has noticed a difficulty in taking a deep breath. He has a history of OSA with CPAP use  greater than 14 years. He states he has the same see CPap machine and the  same settings, without ever following up after original sleep study and diagnosis. He was seen at the urgent care on the onset of shortness of breath, with a normal chest x-ray. He has a history of exercise-induced asthma, and states with the onset of symptoms he used his son's inhaler and did not see an improvement in his symptoms. He reports the feeling is constant, no better or worse with any particular activity or position. He states [place], and thought this was making his symptoms worse, but since thinks that the panic attack because the symptoms subsided rather quickly. He is currently not on any medications.   Health maintenance:  Colonoscopy: completed 04/2016. Immunizations: tdap 2014 UTD, Influenza admin today  Infectious disease screening: HIV unknown  Immunization History  Administered Date(s) Administered  . Influenza,inj,Quad PF,36+ Mos 06/08/2016     Past Medical History:  Diagnosis Date  . Allergy   . GERD  (gastroesophageal reflux disease)   . Heart murmur   . Hx of adenomatous colonic polyps 04/16/2016  . Kidney stone   . Migraines   . OSA on CPAP    Allergies  Allergen Reactions  . Aspirin Hives  . Ibuprofen Hives  . Penicillins Hives   Past Surgical History:  Procedure Laterality Date  . LUMBAR LAMINECTOMY/DECOMPRESSION MICRODISCECTOMY Left 01/04/2013   Procedure: MICRO LUMBAR DECOMPRESSION L4-S1 LEFT    (2 LEVELS)Discectomy at L4-L5;  Surgeon: Johnn Hai, MD;  Location: [facility];  Service: Orthopedics;  Laterality: Left;  . LUMBAR LAMINECTOMY/DECOMPRESSION MICRODISCECTOMY Left 12/21/2013   Procedure: REVISION L4 - L5 LEFT DISCECTOMY 1 LEVEL;  Surgeon: Melina Schools, MD;  Location: [place];  Service: Orthopedics;  Laterality: Left;  . TONSILLECTOMY     Family History  Problem Relation Age of Onset  . Colon polyps Mother   . Heart disease Father     passed away of MI in his 72s.   . Early death Father 101   Social History   Social History  . Marital status: Married    Spouse name: [name]  . Number of children: 3  . Years of education: 69   Occupational History  . Corporate treasurer    Social History Main Topics  . Smoking status: Never Smoker  . Smokeless tobacco: Never Used  . Alcohol use Yes     Comment: occasional  . Drug use: No  . Sexual activity: Yes    Partners: Female     Comment: married  Other Topics Concern  . Not on file   Social History Narrative   married to [place], 3 children.   Graphic designer, BA degree.    Caffeine use.    Wears seatbelt, bicycle helmet.    Smoke detector in the home.    Feels safe in relationships.    Allergies as of 06/08/2016      Reactions   Aspirin Hives   Ibuprofen Hives   Penicillins Hives      Medication List    as of 06/08/2016  1:03 PM   You have not been prescribed any medications.      No results found for this or any previous visit (from the past 2160 hour(s)).  Dg Abd 1 View  Result Date:  07/06/2014 CLINICAL DATA:  Right flank and right lower quadrant pain for 1 day. History of kidney stones. EXAM: ABDOMEN - 1 VIEW COMPARISON:  CT 12/01/2013 FINDINGS: The punctate stones in the right kidney on prior CT are not well seen radiographically, single status tentatively identified in the interpolar region. No definite stones along the course of the ureter or in the pelvis. Punctate stone in the interpolar left kidney is again seen. There is moderate stool in the right colon. No dilated bowel loops to suggest obstruction. No osseous abnormalities. IMPRESSION: 1. Punctate bilateral renal stones. Additional stones in the right kidney on prior CT are not well seen radiographically. No definite stones along the course of the ureter or in the pelvis. 2. Moderate stool in the right colon. Electronically Signed   By: Jeb Levering M.D.   On: 07/06/2014 00:33     ROS: 14 pt review of systems performed and negative (unless mentioned in an HPI)  Objective: BP 136/87 (BP Location: Left Arm, Cuff Size: Large)   Pulse 77   Temp 98.4 F (36.9 C)   Resp 20   Ht 6\' 1"  (1.854 m)   Wt 277 lb (125.6 kg)   SpO2 98%   BMI 36.55 kg/m  Gen: Afebrile. No acute distress. Nontoxic in appearance, well-developed, well-nourished,  pleasant, obese Caucasian male. HENT: AT. [place]. Bilateral TM visualized and normal in appearance, normal external auditory canal. MMM, no oral lesions, adequate dentition. Bilateral nares within normal limits mild erythema. Throat without erythema, ulcerations or exudates. No Cough on exam, no hoarseness on exam. Eyes:Pupils Equal Round Reactive to light, Extraocular movements intact,  Conjunctiva without redness, discharge or icterus. Neck/lymp/endocrine: Supple, no lymphadenopathy CV: RRR no murmur, no edema Chest: CTAB, no wheeze, rhonchi or crackles. Normal Respiratory effort. Good Air movement. Abd: Soft. Obese. NTND. BS present.  Skin:  Warm and well-perfused. Skin  intact. Neuro/Msk:  Normal gait. PERLA. EOMi. Alert. Oriented x3.   Psych: Normal affect, dress and demeanor. Normal speech. Normal thought content and judgment.   Assessment/plan: RAKWON ALFANO is a 46 y.o. male present for establishment of care with complaints of shortness of breath.  Immunization due - Flu Vaccine QUAD 36+ mos PF IM (Fluarix & Fluzone Quad PF)  OSA on CPAP/shortness of breath - Ambulatory referral to Pulmonology - Discussed with patient his symptoms should be further worked up by pulmonology to have pulmonary function test and an updated sleep study, with management of sleep apnea/CPAP. He currently has normal work of breath and normal oxygen saturations. Exam is normal. He does have mild allergy symptoms, and encouraged him to take a daily Zyrtec or Allegra.   Return in about 4 weeks (around 07/06/2016) for CPE.  Electronically signed  by: Howard Pouch, DO [place]

## 2016-06-19 ENCOUNTER — Encounter: Payer: Self-pay | Admitting: Family Medicine

## 2016-06-19 ENCOUNTER — Ambulatory Visit (INDEPENDENT_AMBULATORY_CARE_PROVIDER_SITE_OTHER): Payer: BLUE CROSS/BLUE SHIELD | Admitting: Family Medicine

## 2016-06-19 VITALS — BP 114/80 | HR 84 | Temp 99.1°F | Resp 20 | Ht 73.0 in | Wt 274.0 lb

## 2016-06-19 DIAGNOSIS — Z6836 Body mass index (BMI) 36.0-36.9, adult: Secondary | ICD-10-CM | POA: Diagnosis not present

## 2016-06-19 DIAGNOSIS — Z13220 Encounter for screening for lipoid disorders: Secondary | ICD-10-CM

## 2016-06-19 DIAGNOSIS — Z9989 Dependence on other enabling machines and devices: Secondary | ICD-10-CM

## 2016-06-19 DIAGNOSIS — Z1329 Encounter for screening for other suspected endocrine disorder: Secondary | ICD-10-CM | POA: Diagnosis not present

## 2016-06-19 DIAGNOSIS — G4733 Obstructive sleep apnea (adult) (pediatric): Secondary | ICD-10-CM

## 2016-06-19 DIAGNOSIS — Z131 Encounter for screening for diabetes mellitus: Secondary | ICD-10-CM | POA: Diagnosis not present

## 2016-06-19 DIAGNOSIS — Z130 Encounter for screening for diseases of the blood and blood-forming organs and certain disorders involving the immune mechanism: Secondary | ICD-10-CM | POA: Diagnosis not present

## 2016-06-19 DIAGNOSIS — Z0000 Encounter for general adult medical examination without abnormal findings: Secondary | ICD-10-CM | POA: Insufficient documentation

## 2016-06-19 LAB — CBC WITH DIFFERENTIAL/PLATELET
Basophils Absolute: 0.1 10*3/uL (ref 0.0–0.1)
Basophils Relative: 0.8 % (ref 0.0–3.0)
Eosinophils Absolute: 0.4 10*3/uL (ref 0.0–0.7)
Eosinophils Relative: 6.7 % — ABNORMAL HIGH (ref 0.0–5.0)
HCT: 48 % (ref 39.0–52.0)
Hemoglobin: 16.5 g/dL (ref 13.0–17.0)
Lymphocytes Relative: 35.2 % (ref 12.0–46.0)
Lymphs Abs: 2.2 10*3/uL (ref 0.7–4.0)
MCHC: 34.5 g/dL (ref 30.0–36.0)
MCV: 88.6 fl (ref 78.0–100.0)
Monocytes Absolute: 0.6 10*3/uL (ref 0.1–1.0)
Monocytes Relative: 8.9 % (ref 3.0–12.0)
Neutro Abs: 3.1 10*3/uL (ref 1.4–7.7)
Neutrophils Relative %: 48.4 % (ref 43.0–77.0)
Platelets: 287 10*3/uL (ref 150.0–400.0)
RBC: 5.42 Mil/uL (ref 4.22–5.81)
RDW: 13.2 % (ref 11.5–15.5)
WBC: 6.4 10*3/uL (ref 4.0–10.5)

## 2016-06-19 LAB — COMPLETE METABOLIC PANEL WITH GFR
ALT: 19 U/L (ref 9–46)
AST: 17 U/L (ref 10–40)
Albumin: 4.5 g/dL (ref 3.6–5.1)
Alkaline Phosphatase: 70 U/L (ref 40–115)
BUN: 14 mg/dL (ref 7–25)
CO2: 27 mmol/L (ref 20–31)
Calcium: 9.4 mg/dL (ref 8.6–10.3)
Chloride: 105 mmol/L (ref 98–110)
Creat: 1.22 mg/dL (ref 0.60–1.35)
GFR, Est African American: 83 mL/min (ref >=60)
GFR, Est Non African American: 72 mL/min (ref >=60)
Glucose, Bld: 94 mg/dL (ref 65–99)
Potassium: 4.5 mmol/L (ref 3.5–5.3)
Sodium: 142 mmol/L (ref 135–146)
Total Bilirubin: 0.5 mg/dL (ref 0.2–1.2)
Total Protein: 7.4 g/dL (ref 6.1–8.1)

## 2016-06-19 LAB — LIPID PANEL
Cholesterol: 182 mg/dL (ref 0–200)
HDL: 34.4 mg/dL — ABNORMAL LOW (ref >39.00)
LDL Cholesterol: 120 mg/dL — ABNORMAL HIGH (ref 0–99)
NonHDL: 147.73
Total CHOL/HDL Ratio: 5
Triglycerides: 138 mg/dL (ref 0.0–149.0)
VLDL: 27.6 mg/dL (ref 0.0–40.0)

## 2016-06-19 LAB — TSH: TSH: 1.11 u[IU]/mL (ref 0.35–4.50)

## 2016-06-19 NOTE — Progress Notes (Signed)
Patient ID: Brett Schaefer, male  DOB: [birth year], 46 y.o.   MRN: [id number] Patient Care Team    Relationship Specialty Notifications Start End  Ma Hillock, DO PCP - General Family Medicine  06/08/16   Gatha Mayer, MD Consulting Physician Gastroenterology  06/08/16   Susa Day, MD Consulting Physician Orthopedic Surgery  06/08/16     Subjective:  Brett Schaefer is a 46 y.o. male present for CPE. All past medical history, surgical history, allergies, family history, immunizations, medications and social history were updated in the electronic medical record today. All recent labs, ED visits and hospitalizations within the last year were reviewed.  Health maintenance:  Colonoscopy: last screen 04/2016, recommend follow up Dr.Gessner; resulted multiple polyps. 3 years f/u. Immunizations:  tdap UTD 2014, influenza 2017 UTD Infectious disease screening: HIV unknown screening test offered. PSA: No results found for: PSA, pt was counseled on prostate cancer screenings. No Fhx. Declines today.  Assistive device: none Oxygen [id number]  Patient has a Dental home. Hospitalizations/ED visits: none  Immunization History  Administered Date(s) Administered  . Influenza,inj,Quad PF,36+ Mos 06/08/2016     Past Medical History:  Diagnosis Date  . Allergy   . GERD (gastroesophageal reflux disease)   . Heart murmur   . Hx of adenomatous colonic polyps 04/16/2016  . Kidney stone   . Migraines   . OSA on CPAP    Allergies  Allergen Reactions  . Aspirin Hives  . Ibuprofen Hives  . Penicillins Hives   Past Surgical History:  Procedure Laterality Date  . LUMBAR LAMINECTOMY/DECOMPRESSION MICRODISCECTOMY Left 01/04/2013   Procedure: MICRO LUMBAR DECOMPRESSION L4-S1 LEFT    (2 LEVELS)Discectomy at L4-L5;  Surgeon: Johnn Hai, MD;  Location: [facility];  Service: Orthopedics;  Laterality: Left;  . LUMBAR LAMINECTOMY/DECOMPRESSION MICRODISCECTOMY Left 12/21/2013   Procedure:  REVISION L4 - L5 LEFT DISCECTOMY 1 LEVEL;  Surgeon: Melina Schools, MD;  Location: [place];  Service: Orthopedics;  Laterality: Left;  . TONSILLECTOMY     Family History  Problem Relation Age of Onset  . Colon polyps Mother   . Heart disease Father     passed away of MI in his 34s.   . Early death Father 80   Social History   Social History  . Marital status: Married    Spouse name: [name]  . Number of children: 3  . Years of education: 86   Occupational History  . Corporate treasurer    Social History Main Topics  . Smoking status: Never Smoker  . Smokeless tobacco: Never Used  . Alcohol use Yes     Comment: occasional  . Drug use: No  . Sexual activity: Yes    Partners: Female     Comment: married   Other Topics Concern  . Not on file   Social History Narrative   married to [place], 3 children.   Graphic designer, BA degree.    Caffeine use.    Wears seatbelt, bicycle helmet.    Smoke detector in the home.    Feels safe in relationships.    Allergies as of 06/19/2016      Reactions   Aspirin Hives   Ibuprofen Hives   Penicillins Hives      Medication List    as of 06/19/2016 11:17 AM   You have not been prescribed any medications.      No results found for this or any previous visit (from the past 2160 hour(s)).  Dg Abd 1 View  Result Date: 07/06/2014 CLINICAL DATA:  Right flank and right lower quadrant pain for 1 day. History of kidney stones. EXAM: ABDOMEN - 1 VIEW COMPARISON:  CT 12/01/2013 FINDINGS: The punctate stones in the right kidney on prior CT are not well seen radiographically, single status tentatively identified in the interpolar region. No definite stones along the course of the ureter or in the pelvis. Punctate stone in the interpolar left kidney is again seen. There is moderate stool in the right colon. No dilated bowel loops to suggest obstruction. No osseous abnormalities. IMPRESSION: 1. Punctate bilateral renal stones. Additional stones in the  right kidney on prior CT are not well seen radiographically. No definite stones along the course of the ureter or in the pelvis. 2. Moderate stool in the right colon. Electronically Signed   By: Jeb Levering M.D.   On: 07/06/2014 00:33     ROS: 14 pt review of systems performed and negative (unless mentioned in an HPI)  Objective: BP 114/80 (BP Location: Left Arm, Patient Position: Sitting, Cuff Size: Large)   Pulse 84   Temp 99.1 F (37.3 C)   Resp 20   Ht 6\' 1"  (1.854 m)   Wt 274 lb (124.3 kg)   SpO2 99%   BMI 36.15 kg/m  Gen: Afebrile. No acute distress. Nontoxic in appearance, well-developed, well-nourished,  caucasian male.  HENT: AT. [place]. Bilateral TM visualized  and normal in appearance (mild air fluid levels), normal external auditory canal. MMM, no oral lesions, adequate dentition. Bilateral nares within normal limits. Throat without erythema, ulcerations or exudates. no Cough on exam, no hoarseness on exam. Eyes:Pupils Equal Round Reactive to light, Extraocular movements intact,  Conjunctiva without redness, discharge or icterus. Neck/lymp/endocrine: Supple,no lymphadenopathy, no thyromegaly CV: RRR no murmur, no edema, +2/4 P posterior tibialis pulses. no carotid bruits. No JVD. Chest: CTAB, no wheeze, rhonchi or crackles. mormal  Respiratory effort. good Air movement. Abd: Soft. obese. NTND. BS present. no Masses palpated. No hepatosplenomegaly. No rebound tenderness or guarding. Skin: no rashes, purpura or petechiae. Warm and well-perfused. Skin intact. Neuro/Msk:  Normal gait. PERLA. EOMi. Alert. Oriented x3.  Cranial nerves II through XII intact. Muscle strength 5/5 upper/lower extremity. DTRs equal bilaterally. Psych: Normal affect, dress and demeanor. Normal speech. Normal thought content and judgment.   Assessment/plan: Brett Schaefer is a 46 y.o. male present for CPE Encounter for preventive health examination BMI 36: obesity  Patient was encouraged to  exercise greater than 150 minutes a week. Patient was encouraged to choose a diet filled with fresh fruits and vegetables, and lean meats. AVS provided to patient today for education/recommendation on gender specific health and safety maintenance. Colonoscopy: last screen 04/2016, recommend follow up Dr.Gessner; resulted multiple polyps. 3 years f/u. Immunizations:  tdap UTD 2014, influenza 2017 UTD Infectious disease screening: HIV unknown screening test offered. PSA: No results found for: PSA, pt was counseled on prostate cancer screenings. No Fhx. Declines today.  OSA on CPAP - pulm referral scheduled for end of the month.  Screening for deficiency anemia - CBC w/Diff Thyroid disorder screen - TSH Diabetes mellitus screening - Hemoglobin A1c Screening cholesterol level - COMPLETE METABOLIC PANEL WITH GFR - Lipid panel - strong FHX of Heart disease, Father MI at 30. Early cardiac screenings and precautions.   Return in about 1 year (around 06/19/2017) for CPE.  Electronically signed by: Howard Pouch, DO [place]

## 2016-06-19 NOTE — Patient Instructions (Signed)

## 2016-06-20 LAB — HEMOGLOBIN A1C
Hgb A1c MFr Bld: 5.4 % (ref <5.7)
Mean Plasma Glucose: 108 mg/dL

## 2016-06-22 ENCOUNTER — Telehealth: Payer: Self-pay | Admitting: Family Medicine

## 2016-06-22 NOTE — Telephone Encounter (Signed)
Spoke with patient reviewed lab results. 

## 2016-06-22 NOTE — Telephone Encounter (Signed)
Please call pt: - all his labs look good! - yearly CPE.

## 2016-07-07 ENCOUNTER — Telehealth: Payer: Self-pay | Admitting: Family Medicine

## 2016-07-07 NOTE — Telephone Encounter (Signed)
Patient is calling because he is still coughing and now chest is burning.   He says Dr. Raoul Pitch told him to call for a prescription if his symptoms failed to improve. Patient would like a call back.  Thank you,  -LL

## 2016-07-07 NOTE — Telephone Encounter (Signed)
Spoke with patient advised patient that Dr Raoul Pitch out of the office I have reviewed her notes and didn't see anything in there stating she would call anything in. Her note did state she placed a referral to pulmonary. Patient states he has appt tomorrow with pulmonary advised patient to discuss his symptoms with them at his appt. Patient verbalized understanding.

## 2016-07-08 ENCOUNTER — Encounter: Payer: Self-pay | Admitting: Internal Medicine

## 2016-07-08 ENCOUNTER — Ambulatory Visit (INDEPENDENT_AMBULATORY_CARE_PROVIDER_SITE_OTHER)
Admission: RE | Admit: 2016-07-08 | Discharge: 2016-07-08 | Disposition: A | Discharge status: Home / Self Care | Payer: BLUE CROSS/BLUE SHIELD | Source: Ambulatory Visit | Attending: Internal Medicine | Admitting: Internal Medicine

## 2016-07-08 ENCOUNTER — Ambulatory Visit (INDEPENDENT_AMBULATORY_CARE_PROVIDER_SITE_OTHER): Payer: BLUE CROSS/BLUE SHIELD | Admitting: Internal Medicine

## 2016-07-08 VITALS — BP 114/80 | HR 110 | Temp 103.0°F | Ht 76.0 in | Wt 264.0 lb

## 2016-07-08 DIAGNOSIS — R509 Fever, unspecified: Secondary | ICD-10-CM

## 2016-07-08 DIAGNOSIS — J069 Acute upper respiratory infection, unspecified: Secondary | ICD-10-CM

## 2016-07-08 DIAGNOSIS — R05 Cough: Secondary | ICD-10-CM | POA: Diagnosis not present

## 2016-07-08 DIAGNOSIS — R0602 Shortness of breath: Secondary | ICD-10-CM

## 2016-07-08 MED ORDER — AZITHROMYCIN 250 MG PO TABS: ORAL_TABLET | ORAL | 0 refills | Status: DC

## 2016-07-08 MED ORDER — OSELTAMIVIR PHOSPHATE 75 MG PO CAPS: 75.0000 mg | ORAL_CAPSULE | Freq: Two times a day (BID) | ORAL | 0 refills | Status: DC

## 2016-07-08 NOTE — Patient Instructions (Addendum)
zpak  tamiflu twice daily x 5 days     Try prilosec otc 20mg   Take 30-60 min before first meal of the day and Pepcid ac (famotidine) 20 mg one @  bedtime until cough is completely gone for at least a week without the need for cough suppression    GERD (REFLUX)  is an extremely common cause of respiratory symptoms just like yours , many times with no obvious heartburn at all.    It can be treated with medication, but also with lifestyle changes including elevation of the head of your bed (ideally with 6 inch  bed blocks),  Smoking cessation, avoidance of late meals, excessive alcohol, and avoid fatty foods, chocolate, peppermint, colas, red wine, and acidic juices such as orange juice.  NO MINT OR MENTHOL PRODUCTS SO NO COUGH DROPS   USE SUGARLESS CANDY INSTEAD (Jolley ranchers or Stover's or Life Savers) or even ice chips will also do - the key is to swallow to prevent all throat clearing. NO OIL BASED VITAMINS - use powdered substitutes.  Please remember to go to the x-ray department downstairs in the basement  for your tests - we will call you with the results when they are available.     Please schedule a follow up office visit in 2 weeks, sooner if needed to complete the work up for your breathing

## 2016-07-08 NOTE — Progress Notes (Signed)
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 Pulmonary Consult    Referred by Dr Raoul Pitch. Pt c/o SOB for the past several months. He states SOB is "pretty constant".  He c/o cough for the past few days- prod with brown sputum.  He had fever of 102 last night.   exp to sick children 45/45 years old  dx as virus not flu per pt report then acute onset severe cough / sorethroat and fever x 24 h prior to OV   Assures me sob is not worse than it has been for months, constantly aware day not noct and not proprotionate to activity now but is very sedentary   No obvious day to day or daytime variability or assoc  mucus plugs or hemoptysis or cp or chest tightness, subjective wheeze or overt   hb symptoms. No unusual exp hx or h/o childhood pna/ asthma or knowledge of premature birth.  Sleeping ok without nocturnal  or early am exacerbation  of respiratory  c/o's or need for noct saba. Also denies any obvious fluctuation of symptoms with weather or environmental changes or other aggravating or alleviating factors except as outlined above   Current Medications, Allergies, Complete Past Medical History, Past Surgical History, Family History, and Social History were reviewed in [employer] record.  ROS  The following are not  active complaints unless bolded sore throat, dysphagia, dental problems, itching, sneezing,  nasal congestion or excess/ purulent secretions, ear ache,   fever, chills, sweats, unintended wt loss, classically pleuritic or exertional cp,  orthopnea pnd or leg swelling, presyncope, palpitations, abdominal pain, anorexia, nausea, vomiting, diarrhea  or change in bowel or bladder habits, change in stools or urine, dysuria,hematuria,  rash, arthralgias, visual complaints, headache, numbness, weakness or ataxia or problems with walking or coordination,  change in mood/affect or memory.            Review of Systems     Objective:   Physical Exam    amb wm / alert nad    Wt Readings from Last 3 Encounters:  07/08/16 264 lb (119.7 kg)  06/19/16 274 lb (124.3 kg)  06/08/16 277 lb (125.6 kg)    Vital signs reviewed  - Note on arrival 02 sats  96% on RA and Temp 103   HEENT: nl dentition, turbinates bilaterally, and oropharynx. Nl external ear canals without cough reflex   NECK :  without JVD/Nodes/TM/ nl carotid upstrokes bilaterally   LUNGS: no acc muscle use,  Nl contour chest which is clear to A and P bilaterally without cough on insp or exp maneuvers   CV:  RRR  no s3 or murmur or increase in P2, and no edema   ABD:  soft and nontender with nl inspiratory excursion in the supine position. No bruits or organomegaly appreciated, bowel sounds nl  MS:  Nl gait/ ext warm without deformities, calf tenderness, cyanosis or clubbing No obvious joint restrictions   SKIN: warm and dry without lesions    NEURO:  alert, approp, nl sensorium with  no motor or cerebellar deficits apparent.    CXR PA and Lateral:   07/08/2016 :    I personally reviewed images and agree with radiology impression as follows:    No active cardiopulmonary disease.     Tests ordered 07/08/2016  Influenza A  = POS   Labs  reviewed:      Chemistry      Component Value Date/Time   NA 142 06/19/2016 1115   K  4.5 06/19/2016 1115   CL 105 06/19/2016 1115   CO2 27 06/19/2016 1115   BUN 14 06/19/2016 1115   CREATININE 1.22 06/19/2016 1115      Component Value Date/Time   CALCIUM 9.4 06/19/2016 1115   ALKPHOS 70 06/19/2016 1115   AST 17 06/19/2016 1115   ALT 19 06/19/2016 1115   BILITOT 0.5 06/19/2016 1115        Lab Results  Component Value Date   WBC 6.4 06/19/2016   HGB 16.5 06/19/2016   HCT 48.0 06/19/2016   MCV 88.6 06/19/2016   PLT 287.0 06/19/2016        Lab Results  Component Value Date   TSH 1.11 06/19/2016         Assessment:

## 2016-07-09 ENCOUNTER — Telehealth: Payer: Self-pay | Admitting: Internal Medicine

## 2016-07-09 ENCOUNTER — Encounter: Payer: Self-pay | Admitting: Internal Medicine

## 2016-07-09 DIAGNOSIS — R509 Fever, unspecified: Secondary | ICD-10-CM | POA: Insufficient documentation

## 2016-07-09 LAB — POCT INFLUENZA A/B
Influenza A, POC: POSITIVE — AB
Influenza B, POC: NEGATIVE

## 2016-07-09 NOTE — Assessment & Plan Note (Signed)
Body mass index is 32.14   -  trending down/ encouraged Lab Results  Component Value Date   TSH 1.11 06/19/2016     Contributing to gerd risk/ doe/reviewed the need and the process to achieve and maintain neg calorie balance > defer f/u primary care including intermittently monitoring thyroid status

## 2016-07-09 NOTE — Progress Notes (Signed)
lmtcb

## 2016-07-09 NOTE — Addendum Note (Signed)
Addended by: Rosana Berger on: 07/09/2016 02:17 PM   Modules accepted: Orders

## 2016-07-09 NOTE — Telephone Encounter (Signed)
Patient calling back - He can be reached at [phone] -pr

## 2016-07-09 NOTE — Telephone Encounter (Signed)
I spoke with the pt and notified of his cxr results He verbalized understanding and states nothing further needed

## 2016-07-09 NOTE — Assessment & Plan Note (Addendum)
Onset mid Dec 2017 - empirical gerd rx 07/08/2016 >>>    Symptoms are markedly disproportionate to objective findings and not clear this is a lung problem but pt does appear to have difficult airway management issues. DDX of  difficult airways management almost all start with A and  include Adherence, Ace Inhibitors, Acid Reflux, Active Sinus Disease, Alpha 1 Antitripsin deficiency, Anxiety masquerading as Airways dz,  ABPA,  Allergy(esp in young), Aspiration (esp in elderly), Adverse effects of meds,  Active smokers, A bunch of PE's (a small clot burden can't cause this syndrome unless there is already severe underlying pulm or vascular dz with poor reserve) plus two Bs  = Bronchiectasis and Beta blocker use..and one C= CHF   Adherence is always the initial "prime suspect" and is a multilayered concern that requires a "trust but verify" approach in every patient - starting with knowing how to use medications, especially inhalers, correctly, keeping up with refills and understanding the fundamental difference between maintenance and prns vs those medications only taken for a very short course and then stopped and not refilled.   ? Acid (or non-acid) GERD > always difficult to exclude as up to 75% of pts in some series report no assoc GI/ Heartburn symptoms> rec max (24h)  acid suppression and diet restrictions/ reviewed and instructions given in writing.   ? Anxiety > usually at the bottom of this list of usual suspects but should be much higher on this pt's based on H and P - note breathing is present at rest x months but not while sleeping and no worse with exertion or fevere 103 (ie both should cause much high VE demand) , all typical of anxiety/ panic disorder  ? chf > no orthopnea by hx/ no cm on cxr    Will need to return to sort out this chronic problem when gets over the flu (see separate a/p)   Total time devoted to counseling  > 50 % of initial 60 min office visit:  review case with pt/  discussion of options/alternatives/ personally creating written customized instructions  in presence of pt  then going over those specific  Instructions directly with the pt including how to use all of the meds but in particular covering each new medication in detail and the difference between the maintenance= "automatic" meds and the prns using an action plan format for the latter (If this problem/symptom => do that organization reading Left to right).  Please see AVS from this visit for a full list of these instructions which I personally wrote for this pt and  are unique to this visit.

## 2016-07-09 NOTE — Assessment & Plan Note (Signed)
07/08/2016   Pos Flu > rx tamiflu and zpak since reports brown sputum   Onset of symptoms 07/07/16 so should benefit from tamiflu/ advised re staying home, fluids/ tylenol > to ER if condition worsens

## 2016-07-09 NOTE — Progress Notes (Signed)
Spoke with pt and notified of results per Dr. Wert. Pt verbalized understanding and denied any questions. 

## 2016-09-08 ENCOUNTER — Encounter: Payer: Self-pay | Admitting: Internal Medicine

## 2016-09-08 ENCOUNTER — Ambulatory Visit (INDEPENDENT_AMBULATORY_CARE_PROVIDER_SITE_OTHER): Payer: BLUE CROSS/BLUE SHIELD | Admitting: Internal Medicine

## 2016-09-08 ENCOUNTER — Other Ambulatory Visit (INDEPENDENT_AMBULATORY_CARE_PROVIDER_SITE_OTHER): Payer: BLUE CROSS/BLUE SHIELD

## 2016-09-08 VITALS — BP 140/82 | HR 90 | Ht 73.0 in | Wt 270.0 lb

## 2016-09-08 DIAGNOSIS — Z9989 Dependence on other enabling machines and devices: Secondary | ICD-10-CM

## 2016-09-08 DIAGNOSIS — R0602 Shortness of breath: Secondary | ICD-10-CM

## 2016-09-08 DIAGNOSIS — G4733 Obstructive sleep apnea (adult) (pediatric): Secondary | ICD-10-CM | POA: Diagnosis not present

## 2016-09-08 LAB — CBC WITH DIFFERENTIAL/PLATELET
Basophils Absolute: 0 10*3/uL (ref 0.0–0.1)
Basophils Relative: 0.5 % (ref 0.0–3.0)
Eosinophils Absolute: 0.4 10*3/uL (ref 0.0–0.7)
Eosinophils Relative: 6.3 % — ABNORMAL HIGH (ref 0.0–5.0)
HCT: 48.4 % (ref 39.0–52.0)
Hemoglobin: 16.5 g/dL (ref 13.0–17.0)
Lymphocytes Relative: 30.6 % (ref 12.0–46.0)
Lymphs Abs: 2 10*3/uL (ref 0.7–4.0)
MCHC: 34.2 g/dL (ref 30.0–36.0)
MCV: 87.9 fl (ref 78.0–100.0)
Monocytes Absolute: 0.6 10*3/uL (ref 0.1–1.0)
Monocytes Relative: 9.2 % (ref 3.0–12.0)
Neutro Abs: 3.5 10*3/uL (ref 1.4–7.7)
Neutrophils Relative %: 53.4 % (ref 43.0–77.0)
Platelets: 273 10*3/uL (ref 150.0–400.0)
RBC: 5.5 Mil/uL (ref 4.22–5.81)
RDW: 13.3 % (ref 11.5–15.5)
WBC: 6.5 10*3/uL (ref 4.0–10.5)

## 2016-09-08 LAB — BRAIN NATRIURETIC PEPTIDE: Pro B Natriuretic peptide (BNP): 5 pg/mL (ref 0.0–100.0)

## 2016-09-08 MED ORDER — PANTOPRAZOLE SODIUM 40 MG PO TBEC: 40.0000 mg | DELAYED_RELEASE_TABLET | Freq: Every day | ORAL | 2 refills | Status: DC

## 2016-09-08 MED ORDER — FAMOTIDINE 20 MG PO TABS: ORAL_TABLET | ORAL | 11 refills | Status: DC

## 2016-09-08 NOTE — Progress Notes (Signed)
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 mid dec 2017  noted  Doe x steps then sob while  driving to [place] chest tight  But same weekend no trouble with steps or skiing and that Monday when return to [facility] > cxr fine attributed to stress > no better with saba and referred to pulmonary clinic 07/08/2016 by Dr  Raoul Pitch  And continued with same symptoms 07/07/16 developed diffuse aches no HA spiked to 102 assoc with cough productive of brown mucus and nasal congestion/ sore throat   History of Present Illness  07/08/2016 1st [facility] office visit/ Keyara Ent   Chief Complaint  Patient presents with  . Pulmonary Consult    Referred by Dr Raoul Pitch. Pt c/o SOB for the past several months. He states SOB is "pretty constant".  He c/o cough for the past few days- prod with brown sputum.  He had fever of 102 last night.   exp to sick children 2/45 years old  dx as virus not flu per pt report then acute onset severe cough / sorethroat and fever x 24 h prior to OV   Assures me sob is not worse than it has been for months, constantly aware day not noct and not proprotionate to activity now but is very sedentary  rec zpak  tamiflu twice daily x 5 days  Try prilosec otc 20mg   Take 30-60 min before first meal of the day and Pepcid ac (famotidine) 20 mg one @  bedtime until cough is completely gone for at least a week without the need for cough suppression GERD diet    09/08/2016  f/u ov/Jearline Hirschhorn re:  Chronic doe / acute illness above completely resolved back to baseline/ MO complicated by OSA Chief Complaint  Patient presents with  . Follow-up    Breathing has improved some but not back to baseline. He states that he has trouble taking in a deep enough breath.    Wt fluctuates 250-270 and not really much better at lower wt  [facility] = can walk nl  pace, flat grade, can't hurry or go uphills or steps s sob  Can feel like losing breath sitting talking though phonation is not affected/ sleeps fine x Dec  The doe x years mostly limited by back shooting into L leg    No obvious day to day or daytime variability or assoc excess/ purulent sputum or mucus plugs or hemoptysis or cp or chest tightness, subjective wheeze or overt sinus or hb symptoms. No unusual exp hx or h/o childhood pna/ asthma or knowledge of premature birth.  Sleeping ok but no longer on cpap (old equipment) without nocturnal  or early am exacerbation  of respiratory  c/o's or need for noct saba. Also denies any obvious fluctuation of symptoms with weather or environmental changes or other aggravating or alleviating factors except as outlined above   Current Medications, Allergies, Complete Past Medical History, Past Surgical History, Family History, and Social History were reviewed in [employer] record.  ROS  The following are not active complaints unless bolded sore throat, dysphagia, dental problems, itching, sneezing,  nasal congestion or excess/ purulent secretions, ear ache,   fever, chills, sweats, unintended wt loss, classically pleuritic or exertional cp,  orthopnea pnd or leg swelling, presyncope,  palpitations, abdominal pain, anorexia, nausea, vomiting, diarrhea  or change in bowel or bladder habits, change in stools or urine, dysuria,hematuria,  rash, arthralgias/ chronic back pain rad to L foot , visual complaints, headache, numbness, weakness or ataxia or problems with walking or coordination,  change in mood/affect or memory.                   Objective:   Physical Exam    amb wm / alert nad     09/08/2016        270   07/08/16 264 lb (119.7 kg)  06/19/16 274 lb (124.3 kg)  06/08/16 277 lb (125.6 kg)    Vital signs reviewed  - Note on arrival 02 sats  97% RA    HEENT: nl dentition, turbinates bilaterally, and oropharynx. Nl  external ear canals without cough reflex   NECK :  without JVD/Nodes/TM/ nl carotid upstrokes bilaterally   LUNGS: no acc muscle use,  Nl contour chest which is clear to A and P bilaterally without cough on insp or exp maneuvers   CV:  RRR  no s3 or murmur or increase in P2, and no edema   ABD:  Quite obese/ soft and nontender with limited inspiratory excursion in the supine position. No bruits or organomegaly appreciated, bowel sounds nl  MS:  Nl gait/ ext warm without deformities, calf tenderness, cyanosis or clubbing No obvious joint restrictions   SKIN: warm and dry without lesions    NEURO:  alert, approp, nl sensorium with  no motor or cerebellar deficits apparent.       I personally reviewed images and agree with radiology impression as follows:  CXR:   07/08/16 No active cardiopulmonary disease     Labs ordered/ reviewed:      Chemistry      Component Value Date/Time   NA 142 06/19/2016 1115   K 4.5 06/19/2016 1115   CL 105 06/19/2016 1115   CO2 27 06/19/2016 1115   BUN 14 06/19/2016 1115   CREATININE 1.22 06/19/2016 1115      Component Value Date/Time   CALCIUM 9.4 06/19/2016 1115   ALKPHOS 70 06/19/2016 1115   AST 17 06/19/2016 1115   ALT 19 06/19/2016 1115   BILITOT 0.5 06/19/2016 1115        Lab Results  Component Value Date   WBC 6.5 09/08/2016   HGB 16.5 09/08/2016   HCT 48.4 09/08/2016   MCV 87.9 09/08/2016   PLT 273.0 09/08/2016       EOS                        0.4      09/08/2016       Lab Results  Component Value Date   TSH 1.11 06/19/2016     Lab Results  Component Value Date   PROBNP 5.0 09/08/2016      Allergy profile also sent       Assessment:

## 2016-09-08 NOTE — Patient Instructions (Addendum)
Pantoprazole (protonix) 40 mg   Take  30-60 min before first meal of the day and Pepcid (famotidine)  20 mg one @  bedtime until return to office - this is the best way to tell whether stomach acid is contributing to your problem.     GERD (REFLUX)  is an extremely common cause of respiratory symptoms just like yours , many times with no obvious heartburn at all.    It can be treated with medication, but also with lifestyle changes including elevation of the head of your bed (ideally with 6 inch  bed blocks),  Smoking cessation, avoidance of late meals, excessive alcohol, and avoid fatty foods, chocolate, peppermint, colas, red wine, and acidic juices such as orange juice.  NO MINT OR MENTHOL PRODUCTS SO NO COUGH DROPS   USE SUGARLESS CANDY INSTEAD (Jolley ranchers or Stover's or Life Savers) or even ice chips will also do - the key is to swallow to prevent all throat clearing. NO OIL BASED VITAMINS - use powdered substitutes.  Please see patient coordinator before you leave today  to schedule split night study   Please remember to go to the lab and x-ray department downstairs in the basement  for your tests - we will call you with the results when they are available.     Please schedule a follow up office visit in 4 weeks, sooner if needed

## 2016-09-09 LAB — RESPIRATORY ALLERGY PROFILE REGION II ~~LOC~~
Allergen, A. alternata, m6: <0.1 kU/L
Allergen, C. Herbarum, M2: <0.1 kU/L
Allergen, Cedar tree, t12: <0.1 kU/L
Allergen, Comm Silver Birch, t9: <0.1 kU/L
Allergen, Cottonwood, t14: <0.1 kU/L
Allergen, D pternoyssinus,d7: <0.1 kU/L
Allergen, Mouse Urine Protein, e78: <0.1 kU/L
Allergen, Mulberry, t76: <0.1 kU/L
Allergen, Oak,t7: <0.1 kU/L
Allergen, P. notatum, m1: <0.1 kU/L
Aspergillus fumigatus, m3: <0.1 kU/L
Bermuda Grass: <0.1 kU/L
Box Elder IgE: <0.1 kU/L
Cat Dander: <0.1 kU/L
Cockroach: <0.1 kU/L
Common Ragweed: <0.1 kU/L
D. farinae: <0.1 kU/L
Dog Dander: <0.1 kU/L
Elm IgE: <0.1 kU/L
IgE (Immunoglobulin E), Serum: 36 kU/L (ref <115)
Johnson Grass: <0.1 kU/L
Pecan/Hickory Tree IgE: <0.1 kU/L
Rough Pigweed  IgE: <0.1 kU/L
Sheep Sorrel IgE: <0.1 kU/L
Timothy Grass: <0.1 kU/L

## 2016-09-09 NOTE — Assessment & Plan Note (Signed)
Split night study requested as he says his equipment is antiquated and doesn't really use it much anymore  Will do study first then refer to sleep medicine when w/u for doe is complete

## 2016-09-09 NOTE — Assessment & Plan Note (Addendum)
Onset mid Dec 2017 - 09/08/2016  Walked RA x 3 laps @ 185 ft each stopped due to  End of study, nl pace, no sob or desat    - Spirometry 09/08/2016  Nl with nl curve  - Allergy profile 09/08/2016 >  Eos 0.4 /  IgE   - empirical gerd rx 07/08/2016 >>>  Symptoms are markedly disproportionate to objective findings and not clear this is a lung problem but pt does appear to have difficult airway management issues. The differential diagnosis of difficult to control airways disorders is extensive with no quick and easy answers but easy to remember because it consists of 13 A's,  Two Bs and one C: 1. Adherence, always a challenge and the leading suspect> not an issue here as on no meds yet   2. Acid reflux disease, with the greater proportion of pulmonary patients with no overt heartburn symptoms, and no easy way to treat non-acid reflux> try max gerd rx then cpst if not improving  3. Ace inhibitor use > n/a  5. Active smoking,  > n/a  6. Allergic diseases, usually with a hx dating back to childhood with prominent allergic rhinitis features in up 90% of pts >  Note elevated Eos but no cough or nasal symptoms > send profile  7. Aspiration, a perennial problem in the elderly or other patients at risk > n/a  8. 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 Alpha one Antitrypsin deficiency >  n/a with nl spirometry 10. Adverse effect of inhalers, especially DPI's and especially with poor inhaler technique > n/a 11 Anxiety related to conditioning/ obesity >  always a diagnosis of exclusion, 12. A bunch of PE's ie moderately large clot burden, a few small ones peripherally can cause pleuritic cp syndromes but not unexplained dyspnea> very unlikely based on walking study today  13 Anemia or Thyroid disorders > ruled out today  Two B's 1. Bronchiectasis:  Pos CT is the sine que non here 2  Beta blocker effects:  Coreg and Timolol use are pervasive in the adult population  and both have significant spillover effects on the airways > n/a One C 1. Congestive heart failure,easily  ruled out now with BNP level of < 100 when symptomatic> done  (only 5)    Will start with gerd rx/ w/u for allergies and return for f/u in 4 weeks  I had an extended discussion with the patient reviewing all relevant studies completed to date and  lasting 25 minutes of a 40  minute office visit addressing  persistent non-specific but potentially very serious refractory respiratory symptoms of uncertain and potentially multiple  etiologies.   Each maintenance medication was reviewed in detail including most importantly the difference between maintenance and prns and under what circumstances the prns are to be triggered using an action plan format that is not reflected in the computer generated alphabetically organized AVS.    Please see AVS for specific instructions unique to this office visit that I personally wrote and verbalized to the the pt in detail and then reviewed with pt  by my nurse highlighting any changes in therapy/plan of care  recommended at today's visit.

## 2016-09-09 NOTE — Assessment & Plan Note (Signed)
Body mass index is 35.62 kg/m.  -  trending up Lab Results  Component Value Date   TSH 1.11 06/19/2016     Contributing to gerd risk/ doe/reviewed the need and the process to achieve and maintain neg calorie balance > defer f/u primary care including intermittently monitoring thyroid status

## 2016-09-09 NOTE — Progress Notes (Signed)
Spoke with pt and notified of results per Dr. Wert. Pt verbalized understanding and denied any questions. 

## 2016-09-16 ENCOUNTER — Encounter: Payer: Self-pay | Admitting: Internal Medicine

## 2016-10-07 ENCOUNTER — Ambulatory Visit: Payer: BLUE CROSS/BLUE SHIELD | Admitting: Internal Medicine

## 2016-10-13 ENCOUNTER — Other Ambulatory Visit: Payer: Self-pay | Admitting: Internal Medicine

## 2016-10-13 DIAGNOSIS — G4733 Obstructive sleep apnea (adult) (pediatric): Secondary | ICD-10-CM

## 2016-10-13 DIAGNOSIS — Z9989 Dependence on other enabling machines and devices: Secondary | ICD-10-CM

## 2016-10-21 DIAGNOSIS — M961 Postlaminectomy syndrome, not elsewhere classified: Secondary | ICD-10-CM | POA: Diagnosis not present

## 2016-10-22 ENCOUNTER — Other Ambulatory Visit: Payer: Self-pay

## 2016-10-22 DIAGNOSIS — R0602 Shortness of breath: Secondary | ICD-10-CM

## 2016-10-22 MED ORDER — PANTOPRAZOLE SODIUM 40 MG PO TBEC: 40.0000 mg | DELAYED_RELEASE_TABLET | Freq: Every day | ORAL | 1 refills | Status: DC

## 2016-11-03 ENCOUNTER — Encounter (HOSPITAL_BASED_OUTPATIENT_CLINIC_OR_DEPARTMENT_OTHER): Payer: BLUE CROSS/BLUE SHIELD

## 2016-11-20 DIAGNOSIS — G4733 Obstructive sleep apnea (adult) (pediatric): Secondary | ICD-10-CM | POA: Diagnosis not present

## 2016-11-24 DIAGNOSIS — G4733 Obstructive sleep apnea (adult) (pediatric): Secondary | ICD-10-CM | POA: Diagnosis not present

## 2016-11-25 ENCOUNTER — Other Ambulatory Visit: Payer: Self-pay | Admitting: *Deleted

## 2016-11-25 DIAGNOSIS — G4733 Obstructive sleep apnea (adult) (pediatric): Secondary | ICD-10-CM

## 2016-11-25 DIAGNOSIS — Z9989 Dependence on other enabling machines and devices: Secondary | ICD-10-CM

## 2016-11-30 ENCOUNTER — Telehealth: Payer: Self-pay | Admitting: Internal Medicine

## 2016-11-30 DIAGNOSIS — G4733 Obstructive sleep apnea (adult) (pediatric): Secondary | ICD-10-CM

## 2016-11-30 NOTE — Telephone Encounter (Signed)
Spoke with pt and informed him of his sleep study results per MW. Pt agreed to the cpap titration and this order was placed. He agreed to the ov with VS and this was made. Informed pt that we or someone from the sleep lab will call him with an appt date. Nothing further is needed    Notes recorded by Tanda Rockers, MD on 11/28/2016 at 6:09 AM EDT Call patient : Study is c/w severe sleep apnea - needs a cpap titration study next available but if insurance won't cover the best we can do is cpap 10 / download after 3 m and refer to sleep medicine Dr Halford Chessman either way

## 2016-11-30 NOTE — Progress Notes (Signed)
LMTCB

## 2016-12-03 DIAGNOSIS — Z6836 Body mass index (BMI) 36.0-36.9, adult: Secondary | ICD-10-CM | POA: Diagnosis not present

## 2016-12-03 DIAGNOSIS — R030 Elevated blood-pressure reading, without diagnosis of hypertension: Secondary | ICD-10-CM | POA: Diagnosis not present

## 2016-12-03 DIAGNOSIS — M5126 Other intervertebral disc displacement, lumbar region: Secondary | ICD-10-CM | POA: Diagnosis not present

## 2016-12-10 DIAGNOSIS — M48061 Spinal stenosis, lumbar region without neurogenic claudication: Secondary | ICD-10-CM | POA: Diagnosis not present

## 2016-12-10 DIAGNOSIS — M5126 Other intervertebral disc displacement, lumbar region: Secondary | ICD-10-CM | POA: Diagnosis not present

## 2016-12-18 DIAGNOSIS — R030 Elevated blood-pressure reading, without diagnosis of hypertension: Secondary | ICD-10-CM | POA: Diagnosis not present

## 2016-12-18 DIAGNOSIS — Z6836 Body mass index (BMI) 36.0-36.9, adult: Secondary | ICD-10-CM | POA: Diagnosis not present

## 2016-12-18 DIAGNOSIS — M5126 Other intervertebral disc displacement, lumbar region: Secondary | ICD-10-CM | POA: Diagnosis not present

## 2017-01-28 ENCOUNTER — Ambulatory Visit (HOSPITAL_BASED_OUTPATIENT_CLINIC_OR_DEPARTMENT_OTHER): Payer: BLUE CROSS/BLUE SHIELD | Attending: Internal Medicine | Admitting: Pulmonary Disease

## 2017-01-28 VITALS — Ht 73.0 in | Wt 270.0 lb

## 2017-01-28 DIAGNOSIS — G4733 Obstructive sleep apnea (adult) (pediatric): Secondary | ICD-10-CM | POA: Diagnosis not present

## 2017-01-28 DIAGNOSIS — Z9989 Dependence on other enabling machines and devices: Secondary | ICD-10-CM | POA: Insufficient documentation

## 2017-02-07 DIAGNOSIS — G4733 Obstructive sleep apnea (adult) (pediatric): Secondary | ICD-10-CM | POA: Diagnosis not present

## 2017-02-07 NOTE — Procedures (Signed)
   Patient Name: Brett Schaefer, Brett Schaefer Date: 01/28/2017   Gender: Male  D.O.B: January 06, 1971  Age (years): 46  Referring Provider: Tanda Rockers   Height (inches): 66  Interpreting Physician: Chesley Mires MD, ABSM  Weight (lbs): 270  RPSGT: Baxter Flattery   BMI: 38  MRN: [id number]  Neck Size: 19.00    CLINICAL INFORMATION  The patient is referred for a CPAP titration to treat sleep apnea. SLEEP STUDY TECHNIQUE  As per the AASM Manual for the Scoring of Sleep and Associated Events v2.3 (April 2016) with a hypopnea requiring 4% desaturations.  The channels recorded and monitored were frontal, central and occipital EEG, electrooculogram (EOG), submentalis EMG (chin), nasal and oral airflow, thoracic and abdominal wall motion, anterior tibialis EMG, snore microphone, electrocardiogram, and pulse oximetry. Continuous positive airway pressure (CPAP) was initiated at the beginning of the study and titrated to treat sleep-disordered breathing. MEDICATIONS  Medications self-administered by patient taken the night of the study : N/A  TECHNICIAN COMMENTS  Comments added by technician: RESMED AIR-FIT P10 LARGE PILLOWS WAS USED FOR THIS STUDY. Patient had difficulty initiating sleep.  Comments added by scorer: N/A  RESPIRATORY PARAMETERS  Optimal PAP Pressure (cm): 9 AHI at Optimal Pressure (/hr): 0.0  Overall Minimal O2 (%): 90.00 Supine % at Optimal Pressure (%): 100  Minimal O2 at Optimal Pressure (%): 93.0    SLEEP ARCHITECTURE  The study was initiated at 10:33:48 PM and ended at 5:02:57 AM.  Sleep onset time was 75.4 minutes and the sleep efficiency was 70.9%. The total sleep time was 275.8 minutes.  The patient spent 8.16% of the night in stage N1 sleep, 75.53% in stage N2 sleep, 0.00% in stage N3 and 16.32% in REM.Stage REM latency was 68.0 minutes  Wake after sleep onset was 38.0. Alpha intrusion was absent. Supine sleep was 59.75%. CARDIAC DATA  The 2 lead EKG demonstrated sinus  rhythm. The mean heart rate was 63.34 beats per minute. Other EKG findings include: None.  LEG MOVEMENT DATA  The total Periodic Limb Movements of Sleep (PLMS) were 0. The PLMS index was 0.00. A PLMS index of <15 is considered normal in adults. IMPRESSIONS  - The optimal PAP pressure was 9 cm of water.  - Central sleep apnea was not noted during this titration (CAI = 0.0/h).  - Moderate oxygen desaturations were observed during this titration (min O2 = 90.00%).  - No snoring was audible during this study.  - No cardiac abnormalities were observed during this study.  - Clinically significant periodic limb movements were not noted during this study. Arousals associated with PLMs were rare. DIAGNOSIS  - Obstructive Sleep Apnea (327.23 [G47.33 ICD-10]) RECOMMENDATIONS  - Trial of CPAP therapy on 9 cm H2O with a Large size Resmed Nasal Pillow Mask AirFit P10 mask and heated humidification.  - Avoid alcohol, sedatives and other CNS depressants that may worsen sleep apnea and disrupt normal sleep architecture.  - Sleep hygiene should be reviewed to assess factors that may improve sleep quality.  - Weight management and regular exercise should be initiated or continued. [Electronically signed] 02/07/2017 05:36 PM Chesley Mires MD, [place], American Board of Sleep Medicine  NPI: [id number]

## 2017-02-08 ENCOUNTER — Telehealth: Payer: Self-pay | Admitting: *Deleted

## 2017-02-08 NOTE — Telephone Encounter (Addendum)
-----   Message from Tanda Rockers, MD sent at 02/08/2017 10:04 AM EDT ----- I did addendum to cpap titration report and sent to triage  ----- Message ----- From: Tanda Rockers, MD Sent: 02/07/2017   5:37 PM To: Tanda Rockers, MD, Ma Hillock, DO    Tanda Rockers, MD at 01/28/2017 8:00 PM   Status: Signed    Make sure  > continue  CPAP therapy on 9 cm H2O with a Large size Resmed Nasal Pillow Mask AirFit P10 mask and heated humidification.  Set up f/u Sood next available for sleep consult

## 2017-02-08 NOTE — Progress Notes (Signed)
Make sure  > continue  CPAP therapy on 9 cm H2O with a Large size Resmed Nasal Pillow Mask AirFit P10 mask and heated humidification.  Set up f/u Sood next available for sleep consult

## 2017-02-08 NOTE — Telephone Encounter (Signed)
LMTCB

## 2017-02-10 ENCOUNTER — Ambulatory Visit (INDEPENDENT_AMBULATORY_CARE_PROVIDER_SITE_OTHER): Payer: BLUE CROSS/BLUE SHIELD | Admitting: Internal Medicine

## 2017-02-10 ENCOUNTER — Encounter: Payer: Self-pay | Admitting: Internal Medicine

## 2017-02-10 VITALS — BP 124/80 | HR 88 | Ht 73.0 in | Wt 281.0 lb

## 2017-02-10 DIAGNOSIS — R10813 Right lower quadrant abdominal tenderness: Secondary | ICD-10-CM | POA: Diagnosis not present

## 2017-02-10 DIAGNOSIS — K429 Umbilical hernia without obstruction or gangrene: Secondary | ICD-10-CM

## 2017-02-10 DIAGNOSIS — R1031 Right lower quadrant pain: Secondary | ICD-10-CM

## 2017-02-10 NOTE — Patient Instructions (Signed)
  You have been scheduled for a CT scan of the abdomen and pelvis at [facility] ([address]---this is in the same building as Press photographer).   You are scheduled on ____________ at _____________. You should arrive 15 minutes prior to your appointment time for registration. Please follow the written instructions below on the day of your exam:  WARNING: IF YOU ARE ALLERGIC TO IODINE/X-RAY DYE, PLEASE NOTIFY RADIOLOGY IMMEDIATELY AT [phone]! YOU WILL BE GIVEN A 13 HOUR PREMEDICATION PREP.  1) Do not eat or drink anything after ____________ (4 hours prior to your test) 2) You have been given 2 bottles of oral contrast to drink. The solution may taste  better if refrigerated, but do NOT add ice or any other liquid to this solution. Shake well before drinking.    Drink 1 bottle of contrast @ ________________ (2 hours prior to your exam)  Drink 1 bottle of contrast @ ________________ (1 hour prior to your exam)  You may take any medications as prescribed with a small amount of water except for the following: Metformin, Glucophage, Glucovance, Avandamet, Riomet, Fortamet, Actoplus Met, Janumet, Glumetza or Metaglip. The above medications must be held the day of the exam AND 48 hours after the exam.  The purpose of you drinking the oral contrast is to aid in the visualization of your intestinal tract. The contrast solution may cause some diarrhea. Before your exam is started, you will be given a small amount of fluid to drink. Depending on your individual set of symptoms, you may also receive an intravenous injection of x-ray contrast/dye. Plan on being at [facility] for 30 minutes or longer, depending on the type of exam you are having performed.  This test typically takes 30-45 minutes to complete.  If you have any questions regarding your exam or if you need to reschedule, you may call the CT department at [phone] between the hours of 8:00 am and 5:00 pm,  Monday-Friday.  ________________________________________________________________________  We will call you with results and plans.   I appreciate the opportunity to care for you. Silvano Rusk, MD, [facility]

## 2017-02-10 NOTE — Progress Notes (Signed)
   Brett Schaefer 46 y.o. February 13, 1971 [id number]  Assessment & Plan:   Encounter Diagnoses  Name Primary?  . RLQ abdominal pain Yes  . Right lower quadrant abdominal tenderness without rebound tenderness   . Umbilical hernia without obstruction and without gangrene     He has persistent symptoms. They're not severe but they're aggravating and we have not fully explained them. CTscan of abdomen and pelvis with contrast to further investigate  I appreciate the opportunity to care for this patient. CC: Kuneff, Renee A, DO  Subjective:   Chief Complaint:Right lower quadrant pain  HPI The patient is here for follow-up. He was seen last year by [place] had a colonoscopy,was having rectal bleeding, had some polyps removed including a distal inflamed sigmoid adenoma and the bleeding stopped after that. He has been havingRLQ pain always, worse lying and after meals. There is always some sort of a dull pain there but it can be aggravated. It is not affected by defecation. Allergies  Allergen Reactions  . Aspirin Hives  . Ibuprofen Hives  . Penicillins Hives   Current Meds  Medication Sig  . famotidine (PEPCID) 20 MG tablet One at bedtime  . pantoprazole (PROTONIX) 40 MG tablet Take 1 tablet (40 mg total) by mouth daily. Take 30-60 min before first meal of the day   Past Medical History:  Diagnosis Date  . Allergy   . Fever   . GERD (gastroesophageal reflux disease)   . Heart murmur   . Hx of adenomatous colonic polyps 04/16/2016  . Kidney stone   . Migraines   . OSA on CPAP    Past Surgical History:  Procedure Laterality Date  . LUMBAR LAMINECTOMY/DECOMPRESSION MICRODISCECTOMY Left 01/04/2013   Procedure: MICRO LUMBAR DECOMPRESSION L4-S1 LEFT    (2 LEVELS)Discectomy at L4-L5;  Surgeon: Johnn Hai, MD;  Location: [facility];  Service: Orthopedics;  Laterality: Left;  . LUMBAR LAMINECTOMY/DECOMPRESSION MICRODISCECTOMY Left 12/21/2013   Procedure: REVISION L4 - L5 LEFT DISCECTOMY  1 LEVEL;  Surgeon: Melina Schools, MD;  Location: [facility];  Service: Orthopedics;  Laterality: Left;  . TONSILLECTOMY      Review of Systems As per history of present illness  Objective:   Physical Exam BP 124/80   Pulse 88   Ht 6\' 1"  (1.854 m)   Wt 281 lb (127.5 kg)   BMI 37.07 kg/m  Tender RLQ w/some guarding Small umbilical hernia Neg Carnett's No CVAT or back

## 2017-02-12 DIAGNOSIS — R1031 Right lower quadrant pain: Secondary | ICD-10-CM | POA: Insufficient documentation

## 2017-02-12 DIAGNOSIS — K429 Umbilical hernia without obstruction or gangrene: Secondary | ICD-10-CM | POA: Insufficient documentation

## 2017-02-15 NOTE — Telephone Encounter (Signed)
LMTCB

## 2017-02-16 ENCOUNTER — Inpatient Hospital Stay: Admission: RE | Admit: 2017-02-16 | Payer: BLUE CROSS/BLUE SHIELD | Source: Ambulatory Visit

## 2017-02-16 ENCOUNTER — Encounter: Payer: Self-pay | Admitting: *Deleted

## 2017-02-16 ENCOUNTER — Ambulatory Visit (INDEPENDENT_AMBULATORY_CARE_PROVIDER_SITE_OTHER)
Admission: RE | Admit: 2017-02-16 | Discharge: 2017-02-16 | Disposition: A | Discharge status: Home / Self Care | Payer: BLUE CROSS/BLUE SHIELD | Source: Ambulatory Visit | Attending: Internal Medicine | Admitting: Internal Medicine

## 2017-02-16 DIAGNOSIS — R1031 Right lower quadrant pain: Secondary | ICD-10-CM | POA: Diagnosis not present

## 2017-02-16 DIAGNOSIS — R10813 Right lower quadrant abdominal tenderness: Secondary | ICD-10-CM | POA: Diagnosis not present

## 2017-02-16 MED ORDER — IOPAMIDOL (ISOVUE-300) INJECTION 61%
100.0000 mL | Freq: Once | INTRAVENOUS | Status: AC | PRN
  Administered 2017-02-16: 100 mL via INTRAVENOUS

## 2017-02-16 NOTE — Telephone Encounter (Signed)
Letter mailed

## 2017-02-17 NOTE — Progress Notes (Signed)
My chart note to the patient regarding negative CT scan

## 2017-02-19 ENCOUNTER — Ambulatory Visit (INDEPENDENT_AMBULATORY_CARE_PROVIDER_SITE_OTHER): Payer: BLUE CROSS/BLUE SHIELD | Admitting: Pulmonary Disease

## 2017-02-19 ENCOUNTER — Encounter: Payer: Self-pay | Admitting: Pulmonary Disease

## 2017-02-19 VITALS — BP 122/80 | HR 87 | Ht 73.0 in | Wt 278.8 lb

## 2017-02-19 DIAGNOSIS — G4733 Obstructive sleep apnea (adult) (pediatric): Secondary | ICD-10-CM

## 2017-02-19 NOTE — Progress Notes (Signed)
   Subjective:    Patient ID: CJ EDGELL, male    DOB: 1970-07-08, 46 y.o.   MRN: [id number]  HPI    Review of Systems  Constitutional: Negative for fever and unexpected weight change.  HENT: Negative for congestion, dental problem, ear pain, nosebleeds, postnasal drip, rhinorrhea, sinus pressure, sneezing, sore throat and trouble swallowing.   Eyes: Negative for redness and itching.  Respiratory: Negative for cough, chest tightness, shortness of breath and wheezing.   Cardiovascular: Negative for palpitations and leg swelling.  Gastrointestinal: Negative for nausea and vomiting.  Genitourinary: Negative for dysuria.  Musculoskeletal: Negative for joint swelling.  Skin: Negative for rash.  Allergic/Immunologic: Negative.  Negative for environmental allergies, food allergies and immunocompromised state.  Neurological: Negative for headaches.  Hematological: Does not bruise/bleed easily.  Psychiatric/Behavioral: Negative for dysphoric mood. The patient is not nervous/anxious.        Objective:   Physical Exam        Assessment & Plan:

## 2017-02-19 NOTE — Patient Instructions (Signed)
Will arrange for CPAP set up  Follow up in 2 months after CPAP set up 

## 2017-02-19 NOTE — Progress Notes (Signed)
Current Outpatient Prescriptions on File Prior to Visit  Medication Sig  . famotidine (PEPCID) 20 MG tablet One at bedtime  . pantoprazole (PROTONIX) 40 MG tablet Take 1 tablet (40 mg total) by mouth daily. Take 30-60 min before first meal of the day   No current facility-administered medications on file prior to visit.    Review of Systems  Constitutional: Negative for fever and unexpected weight change.  HENT: Negative for congestion, dental problem, ear pain, nosebleeds, postnasal drip, rhinorrhea, sinus pressure, sneezing, sore throat and trouble swallowing.   Eyes: Negative for redness and itching.  Respiratory: Negative for cough, chest tightness, shortness of breath and wheezing.   Cardiovascular: Negative for palpitations and leg swelling.  Gastrointestinal: Negative for nausea and vomiting.  Genitourinary: Negative for dysuria.  Musculoskeletal: Negative for joint swelling.  Skin: Negative for rash.  Allergic/Immunologic: Negative.  Negative for environmental allergies, food allergies and immunocompromised state.  Neurological: Negative for headaches.  Hematological: Does not bruise/bleed easily.  Psychiatric/Behavioral: Negative for dysphoric mood. The patient is not nervous/anxious.     Chief Complaint  Patient presents with  . Sleep Consult    Pt had sleep study last month, pt has had cpap machine for over 15 years; for insurance purposes pt needs ROV with updated sleep study to get new cpap machine and new supplies with mask. Overall pt is doing well, just in need of new machine     Sleep tests HST 11/20/16 >> AHI 58.7, SaO2 low 76% CPAP titration 01/28/17 >> CPAP 9 cm H2O  Past medical history GERD, Colon polyps, Nephrolithiasis, Migraines  Past surgical history, Family history, Social history, Allergies all reviewed.  Vital Signs BP 122/80 (BP Location: Left Arm, Cuff Size: Normal)   Pulse 87   Ht 6\' 1"  (1.854 m)   Wt 278 lb 12.8 oz (126.5 kg)   SpO2 94%    BMI 36.78 kg/m   History of Present Illness Brett Schaefer is a 46 y.o. male former smoker with obstructive sleep apnea.  He had home sleep study in July.  Showed severe sleep apnea.  He then had CPAP titration in September.    He had sleep study about 15 yrs ago.  He was set up with CPAP.  He has same machine since then.  He doesn't have a DME.  He would get spare parts from his brother.  He can't sleep w/o CPAP.  He has been using nasal pillows and this helps.  Physical Exam  General - pleasant Eyes - pupils reactive ENT - no sinus tenderness, no oral exudate, no LAN, MP 4, enlarged tongue Cardiac - regular, no murmur Chest - no wheeze, rales Abd - soft, non tender Ext - no edema Skin - no rashes Neuro - normal strength Psych - normal mood   Assessment/Plan  Obstructive sleep apnea. - will arrange for new auto CPAP machine - will get him established with a DME to ensure that he gets replacement parts and supplies   Patient Instructions  Will arrange for CPAP set up  Follow up in 2 months after CPAP set up   Chesley Mires, MD [facility] Pager:  [phone] 02/19/2017, 4:06 PM

## 2017-04-17 DIAGNOSIS — S93492A Sprain of other ligament of left ankle, initial encounter: Secondary | ICD-10-CM | POA: Diagnosis not present

## 2017-04-17 DIAGNOSIS — M25572 Pain in left ankle and joints of left foot: Secondary | ICD-10-CM | POA: Diagnosis not present

## 2017-11-17 ENCOUNTER — Telehealth: Payer: Self-pay | Admitting: Internal Medicine

## 2017-11-17 DIAGNOSIS — Z9989 Dependence on other enabling machines and devices: Secondary | ICD-10-CM

## 2017-11-17 DIAGNOSIS — G4733 Obstructive sleep apnea (adult) (pediatric): Secondary | ICD-10-CM

## 2017-11-17 NOTE — Telephone Encounter (Signed)
Called and spoke with Patient's Wife, [name].  Patient had new prescription for CPAP at [facility] in 02/2018.  Per [facility], the order is over 12 month old and will need a new order for new CPAP.  Per 02/19/18 order: New CPAP set.  Please arrange for auto CPAP range 5 to 15 cm H2O with heated humidity and mask of choice.  Please have download sent after two weeks use.  Dr. Halford Chessman, please advise

## 2017-11-17 NOTE — Telephone Encounter (Signed)
Okay to send new order.

## 2017-11-17 NOTE — Telephone Encounter (Signed)
[facility]  Order placed. Patient notified.  Nothing further at this time.  Per [facility] LOV- New CPAP set. Please arrange for auto CPAP range 5 to 15 cm H2O with heated humidity and mask of choice. Please have download sent after two weeks use.

## 2017-11-22 ENCOUNTER — Encounter: Payer: BLUE CROSS/BLUE SHIELD | Admitting: Family Medicine

## 2018-02-23 ENCOUNTER — Ambulatory Visit (HOSPITAL_BASED_OUTPATIENT_CLINIC_OR_DEPARTMENT_OTHER)
Admission: RE | Admit: 2018-02-23 | Discharge: 2018-02-23 | Disposition: A | Discharge status: Home / Self Care | Payer: PRIVATE HEALTH INSURANCE | Source: Ambulatory Visit | Attending: Family | Admitting: Family

## 2018-02-23 ENCOUNTER — Telehealth: Payer: Self-pay | Admitting: Family

## 2018-02-23 ENCOUNTER — Ambulatory Visit: Payer: PRIVATE HEALTH INSURANCE | Admitting: Family

## 2018-02-23 VITALS — BP 156/84 | HR 77 | Temp 98.2°F | Resp 16 | Ht 73.0 in | Wt 285.0 lb

## 2018-02-23 DIAGNOSIS — N200 Calculus of kidney: Secondary | ICD-10-CM | POA: Diagnosis not present

## 2018-02-23 DIAGNOSIS — Z87442 Personal history of urinary calculi: Secondary | ICD-10-CM

## 2018-02-23 DIAGNOSIS — R109 Unspecified abdominal pain: Secondary | ICD-10-CM

## 2018-02-23 DIAGNOSIS — R1032 Left lower quadrant pain: Secondary | ICD-10-CM

## 2018-02-23 LAB — CBC WITH DIFFERENTIAL/PLATELET
Basophils Absolute: 0.1 10*3/uL (ref 0.0–0.1)
Basophils Relative: 0.8 % (ref 0.0–3.0)
Eosinophils Absolute: 0.4 10*3/uL (ref 0.0–0.7)
Eosinophils Relative: 6.7 % — ABNORMAL HIGH (ref 0.0–5.0)
HCT: 48.1 % (ref 39.0–52.0)
Hemoglobin: 16.2 g/dL (ref 13.0–17.0)
Lymphocytes Relative: 30.5 % (ref 12.0–46.0)
Lymphs Abs: 2 10*3/uL (ref 0.7–4.0)
MCHC: 33.6 g/dL (ref 30.0–36.0)
MCV: 88.9 fl (ref 78.0–100.0)
Monocytes Absolute: 0.5 10*3/uL (ref 0.1–1.0)
Monocytes Relative: 8.2 % (ref 3.0–12.0)
Neutro Abs: 3.5 10*3/uL (ref 1.4–7.7)
Neutrophils Relative %: 53.8 % (ref 43.0–77.0)
Platelets: 288 10*3/uL (ref 150.0–400.0)
RBC: 5.41 Mil/uL (ref 4.22–5.81)
RDW: 13.5 % (ref 11.5–15.5)
WBC: 6.5 10*3/uL (ref 4.0–10.5)

## 2018-02-23 LAB — POC URINALSYSI DIPSTICK (AUTOMATED)
Bilirubin, UA: NEGATIVE
Glucose, UA: NEGATIVE
Ketones, UA: NEGATIVE
Leukocytes, UA: NEGATIVE
Nitrite, UA: NEGATIVE
Protein, UA: NEGATIVE
Spec Grav, UA: <=1.005 — AB (ref 1.010–1.025)
Urobilinogen, UA: 0.2 E.U./dL
pH, UA: 6 (ref 5.0–8.0)

## 2018-02-23 LAB — BASIC METABOLIC PANEL
BUN: 12 mg/dL (ref 6–23)
CO2: 32 mEq/L (ref 19–32)
Calcium: 9.2 mg/dL (ref 8.4–10.5)
Chloride: 102 mEq/L (ref 96–112)
Creatinine, Ser: 1.16 mg/dL (ref 0.40–1.50)
GFR: 71.93 mL/min (ref >60.00)
Glucose, Bld: 108 mg/dL — ABNORMAL HIGH (ref 70–99)
Potassium: 4.1 mEq/L (ref 3.5–5.1)
Sodium: 142 mEq/L (ref 135–145)

## 2018-02-23 MED ORDER — TRAMADOL HCL 50 MG PO TABS: 50.0000 mg | ORAL_TABLET | Freq: Three times a day (TID) | ORAL | 0 refills | Status: DC | PRN

## 2018-02-23 NOTE — Telephone Encounter (Signed)
Wrong office

## 2018-02-23 NOTE — Patient Instructions (Signed)
Please complete lab work prior to leaving. °Complete CT scan on the first floor.  °

## 2018-02-23 NOTE — Telephone Encounter (Signed)
Spoke to pt. Advised him no obvious left sided stone. Suspect that he passed a stone already. Still having some residual pain.  Rx sent for tramadol to his pharmacy and he is advised to call if new/worsening symptoms or if symptoms fail to improve. Pt verbalizes understanding.

## 2018-02-23 NOTE — Telephone Encounter (Signed)
Copied from [place] [id number]. Topic: Quick Communication - See Telephone Encounter >> Feb 23, 2018  3:36 PM Sheran Luz wrote: CRM for notification. See Telephone encounter for: 02/23/18.  Pt called inquiring the status of pain medication he states he was told would be sent to pharmacy after visit today. Please advise.

## 2018-02-23 NOTE — Progress Notes (Signed)
Subjective:    Patient ID: Brett Schaefer, male    DOB: 1970-11-08, 47 y.o.   MRN: [id number]  HPI   Patient is a 47 year old male who presents today with chief complaint of left-sided flank pain.  He reports the pain began last night..  Reports that he had severe pain with vomiting this AM.  This pain woke him from his sleep.  Reports that pain now is 7/10.  Denies dysuria/frequency/hematuria.  He reports previous history of kidney stones.      Review of Systems See HPI  Past Medical History:  Diagnosis Date  . Allergy   . Fever   . GERD (gastroesophageal reflux disease)   . Heart murmur   . Hx of adenomatous colonic polyps 04/16/2016  . Kidney stone   . Migraines   . OSA on CPAP      Social History   Socioeconomic History  . Marital status: Married    Spouse name: [name]  . Number of children: 3  . Years of education: 84  . Highest education level: Not on file  Occupational History  . Occupation: Corporate treasurer  Social Needs  . Financial resource strain: Not on file  . Food insecurity:    Worry: Not on file    Inability: Not on file  . Transportation needs:    Medical: Not on file    Non-medical: Not on file  Tobacco Use  . Smoking status: Former Smoker    Packs/day: 1.00    Years: 5.00    Pack years: 5.00    Types: Cigarettes    Last attempt to quit: 05/11/1996    Years since quitting: 21.8  . Smokeless tobacco: Never Used  Substance and Sexual Activity  . Alcohol use: Yes    Comment: occasional  . Drug use: No  . Sexual activity: Yes    Partners: Female    Comment: married  Lifestyle  . Physical activity:    Days per week: Not on file    Minutes per session: Not on file  . Stress: Not on file  Relationships  . Social connections:    Talks on phone: Not on file    Gets together: Not on file    Attends religious service: Not on file    Active member of club or organization: Not on file    Attends meetings of clubs or organizations: Not on  file    Relationship status: Not on file  . Intimate partner violence:    Fear of current or ex partner: Not on file    Emotionally abused: Not on file    Physically abused: Not on file    Forced sexual activity: Not on file  Other Topics Concern  . Not on file  Social History Narrative   married to [place], 3 children.   Graphic designer, BA degree.    Caffeine use.    Wears seatbelt, bicycle helmet.    Smoke detector in the home.    Feels safe in relationships.     Past Surgical History:  Procedure Laterality Date  . LUMBAR LAMINECTOMY/DECOMPRESSION MICRODISCECTOMY Left 01/04/2013   Procedure: MICRO LUMBAR DECOMPRESSION L4-S1 LEFT    (2 LEVELS)Discectomy at L4-L5;  Surgeon: Johnn Hai, MD;  Location: [facility];  Service: Orthopedics;  Laterality: Left;  . LUMBAR LAMINECTOMY/DECOMPRESSION MICRODISCECTOMY Left 12/21/2013   Procedure: REVISION L4 - L5 LEFT DISCECTOMY 1 LEVEL;  Surgeon: Melina Schools, MD;  Location: [facility];  Service: Orthopedics;  Laterality: Left;  .  TONSILLECTOMY      Family History  Problem Relation Age of Onset  . Colon polyps Mother   . Heart disease Father        passed away of MI in his 36s.   . Early death Father 64    Allergies  Allergen Reactions  . Aspirin Hives  . Ibuprofen Hives  . Penicillins Hives    Current Outpatient Medications on File Prior to Visit  Medication Sig Dispense Refill  . famotidine (PEPCID) 20 MG tablet One at bedtime 30 tablet 11  . pantoprazole (PROTONIX) 40 MG tablet Take 1 tablet (40 mg total) by mouth daily. Take 30-60 min before first meal of the day 90 tablet 1   No current facility-administered medications on file prior to visit.     BP (!) 156/84 (BP Location: Left Arm, Patient Position: Sitting, Cuff Size: Large)   Pulse 77   Temp 98.2 F (36.8 C) (Oral)   Resp 16   Ht 6\' 1"  (1.854 m)   Wt 285 lb (129.3 kg)   SpO2 99%   BMI 37.60 kg/m       Objective:   Physical Exam  Constitutional: He is oriented  to person, place, and time. He appears well-developed and well-nourished. No distress.  HENT:  Head: Normocephalic and atraumatic.  Cardiovascular: Normal rate and regular rhythm.  No murmur heard. Pulmonary/Chest: Effort normal and breath sounds normal. No respiratory distress. He has no wheezes. He has no rales.  Abdominal: Soft. He exhibits no distension. There is no tenderness.  Musculoskeletal: He exhibits no edema.  Neurological: He is alert and oriented to person, place, and time.  Skin: Skin is warm and dry.  Psychiatric: He has a normal mood and affect. His behavior is normal. Thought content normal.  GU: Negative CVA tenderness bilaterally        Assessment & Plan:  Flank pain- urinalysis notes trace blood.  CT negative for stone on left.  Notes stable stone on right- non-obstructing.  I suspect that he had a kidney stone that he passed last night.  He is having some residual pain.  As a result I will give him a one-time prescription for as needed tramadol.  He is advised to call if symptoms worsen or fail to improve.  Patient verbalizes understanding.  Of note blood pressure is elevated today.  It has been stable in the last several checks.  Suspect elevation secondary to acute pain. BP Readings from Last 3 Encounters:  02/23/18 (!) 156/84  02/19/17 122/80  02/10/17 124/80

## 2018-02-24 ENCOUNTER — Other Ambulatory Visit (INDEPENDENT_AMBULATORY_CARE_PROVIDER_SITE_OTHER): Payer: PRIVATE HEALTH INSURANCE

## 2018-02-24 DIAGNOSIS — R109 Unspecified abdominal pain: Secondary | ICD-10-CM | POA: Diagnosis not present

## 2018-02-24 LAB — LIPASE: Lipase: 24 U/L (ref 11.0–59.0)

## 2018-02-24 MED ORDER — ONDANSETRON HCL 4 MG PO TABS: 4.0000 mg | ORAL_TABLET | Freq: Three times a day (TID) | ORAL | 0 refills | Status: DC | PRN

## 2018-02-24 NOTE — Telephone Encounter (Signed)
I attempted to reach patient- got voicemail.  Please call patient and let him know that I reviewed his CT scan again today. Report notes only small non-obstructive stone  On the right which is not new.  No stone on left. Bowels all looked ok on CT.   I sent an rx for zofran to use as needed for his nausea/vomitting.  Since he is no better today, I would recommend re-evaluation in the office by another provider today.

## 2018-02-24 NOTE — Addendum Note (Signed)
Addended by: Debbrah Alar on: 02/24/2018 11:23 AM   Modules accepted: Orders

## 2018-02-24 NOTE — Telephone Encounter (Addendum)
Pt saw Brett Schaefer yesterday. Pt is still having back pain and vomiting. Please advise. Pt wife would like someone to take another look at ct scan. Pt has taken pain medication last night and this morning.

## 2018-02-25 ENCOUNTER — Ambulatory Visit: Payer: Self-pay | Admitting: *Deleted

## 2018-02-25 ENCOUNTER — Ambulatory Visit: Payer: PRIVATE HEALTH INSURANCE | Admitting: Family

## 2018-02-25 ENCOUNTER — Encounter: Payer: Self-pay | Admitting: Family

## 2018-02-25 VITALS — BP 138/80 | HR 62 | Temp 98.6°F | Resp 18 | Ht 73.0 in | Wt 281.0 lb

## 2018-02-25 DIAGNOSIS — M549 Dorsalgia, unspecified: Secondary | ICD-10-CM | POA: Diagnosis not present

## 2018-02-25 MED ORDER — VALACYCLOVIR HCL 1 G PO TABS: 1000.0000 mg | ORAL_TABLET | Freq: Three times a day (TID) | ORAL | 0 refills | Status: DC

## 2018-02-25 MED ORDER — HYDROCODONE-ACETAMINOPHEN 5-325 MG PO TABS: 1.0000 | ORAL_TABLET | Freq: Four times a day (QID) | ORAL | 0 refills | Status: DC | PRN

## 2018-02-25 NOTE — Telephone Encounter (Signed)
Please contact pt to arrange a follow up visit today.  He is a patient at [facility] but we can see him back here if they are full.

## 2018-02-25 NOTE — Progress Notes (Signed)
Subjective:    Patient ID: Brett Schaefer, male    DOB: 05/31/1970, 47 y.o.   MRN: [id number]  HPI  Patient is a 47 yr old male who presents today with chief complaint of flank pain. I saw him for same on 10/16. He had trace blood in urine as well as a hx of kidney stone. CT noted right sided non-obstructive stone but no stone on the left. He was given rx for tramadol. Used tramadol without much improvement.  Has had associated nausea/vomitting.  CBC/CMET were unremarkable. Yesterday he noted some epigastric pain when I spoke to him on the phone and we added on a lipase to his labs which was WNL.  He has history of back pain but feels like this pain is different from his previous back pain symptoms.   He reports that his pain is 10/10 in the AM. Pain is located on the left Schaefer of his back and radiates around the left rib cage.  Notes that the pain is sharp "like needles on the inside."  Denies dysuria.    Review of Systems    see HPI  Past Medical History:  Diagnosis Date  . Allergy   . Fever   . GERD (gastroesophageal reflux disease)   . Heart murmur   . Hx of adenomatous colonic polyps 04/16/2016  . Kidney stone   . Migraines   . OSA on CPAP      Social History   Socioeconomic History  . Marital status: Married    Spouse name: Brett Schaefer  . Number of children: 3  . Years of education: 35  . Highest education level: Not on file  Occupational History  . Occupation: Corporate treasurer  Social Needs  . Financial resource strain: Not on file  . Food insecurity:    Worry: Not on file    Inability: Not on file  . Transportation needs:    Medical: Not on file    Non-medical: Not on file  Tobacco Use  . Smoking status: Former Smoker    Packs/day: 1.00    Years: 5.00    Pack years: 5.00    Types: Cigarettes    Last attempt to quit: 05/11/1996    Years since quitting: 21.8  . Smokeless tobacco: Never Used  Substance and Sexual Activity  . Alcohol use: Yes    Comment:  occasional  . Drug use: No  . Sexual activity: Yes    Partners: Female    Comment: married  Lifestyle  . Physical activity:    Days per week: Not on file    Minutes per session: Not on file  . Stress: Not on file  Relationships  . Social connections:    Talks on phone: Not on file    Gets together: Not on file    Attends religious service: Not on file    Active member of club or organization: Not on file    Attends meetings of clubs or organizations: Not on file    Relationship status: Not on file  . Intimate partner violence:    Fear of current or ex partner: Not on file    Emotionally abused: Not on file    Physically abused: Not on file    Forced sexual activity: Not on file  Other Topics Concern  . Not on file  Social History Narrative   married to Brett Schaefer, 3 children.   Graphic designer, BA degree.    Caffeine use.    Wears seatbelt,  bicycle helmet.    Smoke detector in the home.    Feels safe in relationships.     Past Surgical History:  Procedure Laterality Date  . LUMBAR LAMINECTOMY/DECOMPRESSION MICRODISCECTOMY Left 01/04/2013   Procedure: MICRO LUMBAR DECOMPRESSION L4-S1 LEFT    (2 LEVELS)Discectomy at L4-L5;  Surgeon: Johnn Hai, MD;  Location: [facility];  Service: Orthopedics;  Laterality: Left;  . LUMBAR LAMINECTOMY/DECOMPRESSION MICRODISCECTOMY Left 12/21/2013   Procedure: REVISION L4 - L5 LEFT DISCECTOMY 1 LEVEL;  Surgeon: Melina Schools, MD;  Location: [facility];  Service: Orthopedics;  Laterality: Left;  . TONSILLECTOMY      Family History  Problem Relation Age of Onset  . Colon polyps Mother   . Heart disease Father        passed away of MI in his 14s.   . Early death Father 46    Allergies  Allergen Reactions  . Aspirin Hives  . Ibuprofen Hives  . Penicillins Hives    Current Outpatient Medications on File Prior to Visit  Medication Sig Dispense Refill  . ondansetron (ZOFRAN) 4 MG tablet Take 1 tablet (4 mg total) by mouth every 8 (eight) hours  as needed for nausea or vomiting. 20 tablet 0  . traMADol (ULTRAM) 50 MG tablet Take 1 tablet (50 mg total) by mouth every 8 (eight) hours as needed. 12 tablet 0  . famotidine (PEPCID) 20 MG tablet One at bedtime (Patient not taking: Reported on 02/25/2018) 30 tablet 11  . pantoprazole (PROTONIX) 40 MG tablet Take 1 tablet (40 mg total) by mouth daily. Take 30-60 min before first meal of the day (Patient not taking: Reported on 02/25/2018) 90 tablet 1   No current facility-administered medications on file prior to visit.     BP 138/80 (BP Location: Right Arm, Patient Position: Standing, Cuff Size: Large)   Pulse 62   Temp 98.6 F (37 C) (Oral)   Resp 18   Ht 6\' 1"  (1.854 m)   Wt 281 lb (127.5 kg)   SpO2 98%   BMI 37.07 kg/m    Objective:   Physical Exam  Constitutional: He is oriented to person, place, and time. He appears well-developed and well-nourished. No distress.  HENT:  Head: Normocephalic and atraumatic.  Cardiovascular: Normal rate and regular rhythm.  No murmur heard. Pulmonary/Chest: Effort normal and breath sounds normal. No respiratory distress. He has no wheezes. He has no rales.  Abdominal: Soft. Bowel sounds are normal. He exhibits no distension and no mass. There is no tenderness. There is no guarding.  Musculoskeletal: He exhibits no edema.  Neurological: He is alert and oriented to person, place, and time.  Skin: Skin is warm and dry.  Two lesions noted on skin left posterior back  Psychiatric: He has a normal Schaefer and affect. His behavior is normal. Thought content normal.           Assessment & Plan:  Acute back pain- suspect early herpes zoster. There are a few lesions in the are of his pain as well as tenderness to palpation in this region.  I have advised the patient as follows:  Please begin valtrex for possible shingles outbreak. You may use hydrocodone 1 tab every 6 hours as needed for severe pain. You may also apply salon pas pads to the  affected area as needed for pain.  Call if symptoms worsen or if not improved in 1 week.    Patient verbalizes understanding.

## 2018-02-25 NOTE — Telephone Encounter (Signed)
Patient called office to let provider know that he is not better. He was seen earlier in the week and had thought he was/has passed kidney stone. Patient states his symptoms are no better and he has continued to vomit and have left sided/flank pain. Patient states he has seen blood streaks in vomit twice. Office requested patient be triaged and during call they called the wife to let them know they could be scheduled. Patient instructed if he has any changed before that appointment he should go directly to ED= he states understanding. He is aware his note will be sent for review.  Reason for Disposition . [1] MILD-MODERATE pain AND [2] constant AND [3] present > 2 hours  Answer Assessment - Initial Assessment Questions 1. LOCATION: "Where does it hurt?"      All on left side and back flank 2. RADIATION: "Does the pain shoot anywhere else?" (e.g., chest, back)     Side and to abdomin a little 3. ONSET: "When did the pain begin?" (Minutes, hours or days ago)      Tuesday night 4. SUDDEN: "Gradual or sudden onset?"     Sudden onset- severe pain- residual pain stays 5. PATTERN "Does the pain come and go, or is it constant?"    - If constant: "Is it getting better, staying the same, or worsening?"      (Note: Constant means the pain never goes away completely; most serious pain is constant and it progresses)     - If intermittent: "How long does it last?" "Do you have pain now?"     (Note: Intermittent means the pain goes away completely between bouts)     Acute pain is not constant- dull pain is constant- yes 6. SEVERITY: "How bad is the pain?"  (e.g., Scale 1-10; mild, moderate, or severe)    - MILD (1-3): doesn't interfere with normal activities, abdomen soft and not tender to touch     - MODERATE (4-7): interferes with normal activities or awakens from sleep, tender to touch     - SEVERE (8-10): excruciating pain, doubled over, unable to do any normal activities       Fluctuates to 10-5 7.  RECURRENT SYMPTOM: "Have you ever had this type of abdominal pain before?" If so, ask: "When was the last time?" and "What happened that time?"      Patient had thought pain was similar to kidney stone- morning is worst pain 8. CAUSE: "What do you think is causing the abdominal pain?"     Thought kidney stone - but maybe not 9. RELIEVING/AGGRAVATING FACTORS: "What makes it better or worse?" (e.g., movement, antacids, bowel movement)     Positional may help at times-  10. OTHER SYMPTOMS: "Has there been any vomiting, diarrhea, constipation, or urine problems?"       Vomiting- once today( every morning), nausea, frequency  Protocols used: ABDOMINAL PAIN - MALE-A-AH

## 2018-02-25 NOTE — Telephone Encounter (Signed)
Author spoke to wife, who stated that they had checked at [facility], and there were no appointments available. Wife denies rash or fever. Melissa agreed to see him today at 4PM to follow up on symptoms; appointment made.

## 2018-02-25 NOTE — Patient Instructions (Signed)
Please begin valtrex for possible shingles outbreak. You my use hydrocodone 1 tab every 6 hours as needed for severe pain. You may also apply salon pas pads to the affected area as needed for pain.  Call if symptoms worsen or if not improved in 1 week.

## 2018-03-04 NOTE — Telephone Encounter (Signed)
Follow up call made to patient. States he did get CT results earlier.

## 2018-07-14 ENCOUNTER — Ambulatory Visit: Payer: BLUE CROSS/BLUE SHIELD | Admitting: Family Medicine

## 2018-07-14 ENCOUNTER — Encounter: Payer: Self-pay | Admitting: Family Medicine

## 2018-07-14 VITALS — BP 132/77 | HR 102 | Temp 98.9°F | Resp 16 | Ht 73.0 in | Wt 284.4 lb

## 2018-07-14 DIAGNOSIS — J0100 Acute maxillary sinusitis, unspecified: Secondary | ICD-10-CM

## 2018-07-14 DIAGNOSIS — R059 Cough, unspecified: Secondary | ICD-10-CM

## 2018-07-14 DIAGNOSIS — R05 Cough: Secondary | ICD-10-CM | POA: Diagnosis not present

## 2018-07-14 LAB — POC INFLUENZA A&B (BINAX/QUICKVUE)
Influenza A, POC: NEGATIVE
Influenza B, POC: NEGATIVE

## 2018-07-14 MED ORDER — DOXYCYCLINE HYCLATE 100 MG PO TABS: 100.0000 mg | ORAL_TABLET | Freq: Two times a day (BID) | ORAL | 0 refills | Status: DC

## 2018-07-14 MED ORDER — FLUTICASONE PROPIONATE 50 MCG/ACT NA SUSP: 2.0000 | Freq: Every day | NASAL | 1 refills | Status: DC

## 2018-07-14 NOTE — Patient Instructions (Signed)
Rest, hydrate.  Start  mucinex (DM if cough) Doxycyline and flonase prescribed, take until antibiotic completed.  If cough present it can last up to 6-8 weeks.  F/U 2 weeks of not improved.     Sinusitis, Adult Sinusitis is soreness and swelling (inflammation) of your sinuses. Sinuses are hollow spaces in the bones around your face. They are located:  Around your eyes.  In the middle of your forehead.  Behind your nose.  In your cheekbones. Your sinuses and nasal passages are lined with a fluid called mucus. Mucus drains out of your sinuses. Swelling can trap mucus in your sinuses. This lets germs (bacteria, virus, or fungus) grow, which leads to infection. Most of the time, this condition is caused by a virus. What are the causes? This condition is caused by:  Allergies.  Asthma.  Germs.  Things that block your nose or sinuses.  Growths in the nose (nasal polyps).  Chemicals or irritants in the air.  Fungus (rare). What increases the risk? You are more likely to develop this condition if:  You have a weak body defense system (immune system).  You do a lot of swimming or diving.  You use nasal sprays too much.  You smoke. What are the signs or symptoms? The main symptoms of this condition are pain and a feeling of pressure around the sinuses. Other symptoms include:  Stuffy nose (congestion).  Runny nose (drainage).  Swelling and warmth in the sinuses.  Headache.  Toothache.  A cough that may get worse at night.  Mucus that collects in the throat or the back of the nose (postnasal drip).  Being unable to smell and taste.  Being very tired (fatigue).  A fever.  Sore throat.  Bad breath. How is this diagnosed? This condition is diagnosed based on:  Your symptoms.  Your medical history.  A physical exam.  Tests to find out if your condition is short-term (acute) or long-term (chronic). Your doctor may: ? Check your nose for growths  (polyps). ? Check your sinuses using a tool that has a light (endoscope). ? Check for allergies or germs. ? Do imaging tests, such as an MRI or CT scan. How is this treated? Treatment for this condition depends on the cause and whether it is short-term or long-term.  If caused by a virus, your symptoms should go away on their own within 10 days. You may be given medicines to relieve symptoms. They include: ? Medicines that shrink swollen tissue in the nose. ? Medicines that treat allergies (antihistamines). ? A spray that treats swelling of the nostrils. ? Rinses that help get rid of thick mucus in your nose (nasal saline washes).  If caused by bacteria, your doctor may wait to see if you will get better without treatment. You may be given antibiotic medicine if you have: ? A very bad infection. ? A weak body defense system.  If caused by growths in the nose, you may need to have surgery. Follow these instructions at home: Medicines  Take, use, or apply over-the-counter and prescription medicines only as told by your doctor. These may include nasal sprays.  If you were prescribed an antibiotic medicine, take it as told by your doctor. Do not stop taking the antibiotic even if you start to feel better. Hydrate and humidify   Drink enough water to keep your pee (urine) pale yellow.  Use a cool mist humidifier to keep the humidity level in your home above 50%.  Breathe  in steam for 10-15 minutes, 3-4 times a day, or as told by your doctor. You can do this in the bathroom while a hot shower is running.  Try not to spend time in cool or dry air. Rest  Rest as much as you can.  Sleep with your head raised (elevated).  Make sure you get enough sleep each night. General instructions   Put a warm, moist washcloth on your face 3-4 times a day, or as often as told by your doctor. This will help with discomfort.  Wash your hands often with soap and water. If there is no soap and  water, use hand sanitizer.  Do not smoke. Avoid being around people who are smoking (secondhand smoke).  Keep all follow-up visits as told by your doctor. This is important. Contact a doctor if:  You have a fever.  Your symptoms get worse.  Your symptoms do not get better within 10 days. Get help right away if:  You have a very bad headache.  You cannot stop throwing up (vomiting).  You have very bad pain or swelling around your face or eyes.  You have trouble seeing.  You feel confused.  Your neck is stiff.  You have trouble breathing. Summary  Sinusitis is swelling of your sinuses. Sinuses are hollow spaces in the bones around your face.  This condition is caused by tissues in your nose that become inflamed or swollen. This traps germs. These can lead to infection.  If you were prescribed an antibiotic medicine, take it as told by your doctor. Do not stop taking it even if you start to feel better.  Keep all follow-up visits as told by your doctor. This is important. This information is not intended to replace advice given to you by your health care provider. Make sure you discuss any questions you have with your health care provider. Document Released: 10/14/2007 Document Revised: 09/27/2017 Document Reviewed: 09/27/2017 Elsevier Interactive Patient Education  2019 [employer].

## 2018-07-14 NOTE — Progress Notes (Signed)
Brett Schaefer , 09-08-1970, 48 y.o., male MRN: [id number] Patient Care Team    Relationship Specialty Notifications Start End  Brett Hillock, DO PCP - General Family Medicine  06/08/16   Brett Mayer, MD Consulting Physician Gastroenterology  06/08/16   Brett Day, MD Consulting Physician Orthopedic Surgery  06/08/16     Chief Complaint  Patient presents with  . URI     Subjective: Pt presents for an OV with complaints of congestion of 3 weeks duration, that is worsening over hte last 2 days.  Associated symptoms include severe congestion, runny nose, sore throat, chest congestion, chills and sinus pressure. He denies fever or ear pain. He is tolerating PO.  Flu shot not received. No travel. No exposure.  Pt has tried decongestant to ease their symptoms.   Depression screen [facility] 2/9 06/19/2016 06/08/2016  Decreased Interest 0 0  Down, Depressed, Hopeless 0 0  PHQ - 2 Score 0 0   Allergies  Allergen Reactions  . Aspirin Hives  . Ibuprofen Hives  . Penicillins Hives   Social History   Social History Narrative   married to Brett Schaefer, 3 children.   Graphic designer, BA degree.    Caffeine use.    Wears seatbelt, bicycle helmet.    Smoke detector in the home.    Feels safe in relationships.    Past Medical History:  Diagnosis Date  . Allergy   . Fever   . GERD (gastroesophageal reflux disease)   . Heart murmur   . Hx of adenomatous colonic polyps 04/16/2016  . Kidney stone   . Migraines   . OSA on CPAP    Past Surgical History:  Procedure Laterality Date  . LUMBAR LAMINECTOMY/DECOMPRESSION MICRODISCECTOMY Left 01/04/2013   Procedure: MICRO LUMBAR DECOMPRESSION L4-S1 LEFT    (2 LEVELS)Discectomy at L4-L5;  Surgeon: Brett Hai, MD;  Location: [facility];  Service: Orthopedics;  Laterality: Left;  . LUMBAR LAMINECTOMY/DECOMPRESSION MICRODISCECTOMY Left 12/21/2013   Procedure: REVISION L4 - L5 LEFT DISCECTOMY 1 LEVEL;  Surgeon: Brett Schools, MD;  Location: [place];   Service: Orthopedics;  Laterality: Left;  . TONSILLECTOMY     Family History  Problem Relation Age of Onset  . Colon polyps Mother   . Heart disease Father        passed away of MI in his 29s.   . Early death Father 59   Allergies as of 07/14/2018      Reactions   Aspirin Hives   Ibuprofen Hives   Penicillins Hives      Medication List    as of July 14, 2018 11:45 AM   You have not been prescribed any medications.     All past medical history, surgical history, allergies, family history, immunizations andmedications were updated in the EMR today and reviewed under the history and medication portions of their EMR.     ROS: Negative, with the exception of above mentioned in HPI   Objective:  BP 132/77 (BP Location: Right Arm, Patient Position: Sitting, Cuff Size: Large)   Pulse (!) 102   Temp 98.9 F (37.2 C) (Oral)   Resp 16   Ht 6\' 1"  (1.854 m)   Wt 284 lb 6.4 oz (129 kg)   SpO2 94%   BMI 37.52 kg/m  Body mass index is 37.52 kg/m. Gen: Afebrile. No acute distress. Nontoxic in appearance, well developed, well nourished.  HENT: AT. Schaefer. Bilateral TM visualized w/ bilateral fullness - no erytrthema. MMM,  no oral lesions. Bilateral nares with erythema, drainage and swelling. Throat without erythema or exudates. PND present, cough present. Max sinus pressure present.  Eyes:Pupils Equal Round Reactive to light, Extraocular movements intact,  Conjunctiva without redness, discharge or icterus. Neck/lymp/endocrine: Supple,no lymphadenopathy CV: tachycardic, no murmur  Chest: CTAB, no wheeze or crackles. Good air movement, normal resp effort.  Abd: Soft. NTND. BS present Skin: no rashes, purpura or petechiae.  Neuro: Normal gait. PERLA. EOMi. Alert. Oriented x3 No exam data present No results found. Results for orders placed or performed in visit on 07/14/18 (from the past 24 hour(s))  POC Influenza A&B(BINAX/QUICKVUE)     Status: Normal   Collection Time: 07/14/18 11:43 AM    Result Value Ref Range   Influenza A, POC Negative Negative   Influenza B, POC Negative Negative    Assessment/Plan: Brett Schaefer is a 48 y.o. male present for OV for  Cough/Acute non-recurrent maxillary sinusitis - Flu negative today Rest, hydrate.  Start  mucinex (DM if cough) Doxycyline and flonase prescribed, take until completed.  If cough present it can last up to 6-8 weeks.  F/U 2 weeks of not improved.    Reviewed expectations re: course of current medical issues.  Discussed self-management of symptoms.  Outlined signs and symptoms indicating need for more acute intervention.  Patient verbalized understanding and all questions were answered.  Patient received an After-Visit Summary.    Orders Placed This Encounter  Procedures  . POC Influenza A&B(BINAX/QUICKVUE)     Note is dictated utilizing voice recognition software. Although note has been proof read prior to signing, occasional typographical errors still can be missed. If any questions arise, please do not hesitate to call for verification.   electronically signed by:  Howard Pouch, DO  [place]

## 2018-08-02 ENCOUNTER — Telehealth: Payer: Self-pay | Admitting: Family Medicine

## 2018-08-02 NOTE — Telephone Encounter (Signed)
Copied from [place] [phone]. Topic: Quick Communication - See Telephone Encounter >> Aug 02, 2018  2:14 PM Berneta Levins wrote: CRM for notification. See Telephone encounter for: 08/02/18.  Pt called and left VM on [facility] mailbox 1:56pm.  Pt states that he was seen a few weeks ago and given drugs.  Pt still having same symptoms (severe sinus pressure, trouble breathing out of nose, no fever). Pt wants to know what else can be done. Pt can be reached at [phone]

## 2018-08-03 ENCOUNTER — Other Ambulatory Visit: Payer: Self-pay | Admitting: Family Medicine

## 2018-08-03 ENCOUNTER — Encounter: Payer: Self-pay | Admitting: Family Medicine

## 2018-08-03 NOTE — Telephone Encounter (Signed)
LM for pt to return call to get more information and also see if patient would be okay with scheduling phone visit.

## 2018-08-04 ENCOUNTER — Telehealth: Payer: Self-pay | Admitting: Pulmonary Disease

## 2018-08-04 ENCOUNTER — Ambulatory Visit: Payer: BLUE CROSS/BLUE SHIELD | Admitting: Family Medicine

## 2018-08-04 ENCOUNTER — Ambulatory Visit: Payer: BLUE CROSS/BLUE SHIELD | Admitting: Nurse Practitioner

## 2018-08-04 NOTE — Telephone Encounter (Signed)
Called and spoke with Patient.  Patient stated he has used the same cpap for 15 years.  Patient is requesting a order for new cpap and supplies.  DME- [facility], per previous order.  Patient scheduled with Kenney Houseman, NP, at 0900, for tele visit, 08/05/18. Patient aware he will receive a call at 0900.  Patient aware tele visit is treated as OV. Nothing further at this time.

## 2018-08-05 ENCOUNTER — Encounter: Payer: Self-pay | Admitting: Pulmonary Disease

## 2018-08-05 ENCOUNTER — Other Ambulatory Visit: Payer: Self-pay

## 2018-08-05 ENCOUNTER — Encounter: Payer: Self-pay | Admitting: Family Medicine

## 2018-08-05 ENCOUNTER — Ambulatory Visit (INDEPENDENT_AMBULATORY_CARE_PROVIDER_SITE_OTHER): Payer: BLUE CROSS/BLUE SHIELD | Admitting: Pulmonary Disease

## 2018-08-05 ENCOUNTER — Ambulatory Visit (INDEPENDENT_AMBULATORY_CARE_PROVIDER_SITE_OTHER): Payer: BLUE CROSS/BLUE SHIELD | Admitting: Family Medicine

## 2018-08-05 DIAGNOSIS — Z9989 Dependence on other enabling machines and devices: Secondary | ICD-10-CM | POA: Diagnosis not present

## 2018-08-05 DIAGNOSIS — J0101 Acute recurrent maxillary sinusitis: Secondary | ICD-10-CM | POA: Diagnosis not present

## 2018-08-05 DIAGNOSIS — G4733 Obstructive sleep apnea (adult) (pediatric): Secondary | ICD-10-CM

## 2018-08-05 MED ORDER — AZITHROMYCIN 250 MG PO TABS: ORAL_TABLET | ORAL | 0 refills | Status: DC

## 2018-08-05 MED ORDER — PREDNISONE 20 MG PO TABS: ORAL_TABLET | ORAL | 0 refills | Status: DC

## 2018-08-05 NOTE — Patient Instructions (Addendum)
New CPAP DME: [facility]  Same settings  Mask of choice  Supplies   Follow-up with our office telephonically in 10 weeks with Brett Quaker, FNP  We recommend that you continue using your CPAP daily >>>Keep up the hard work using your device >>> Goal should be wearing this for the entire night that you are sleeping, at least 4 to 6 hours  Remember:  . Do not drive or operate heavy machinery if tired or drowsy.  . Please notify the supply company and office if you are unable to use your device regularly due to missing supplies or machine being broken.  . Work on maintaining a healthy weight and following your recommended nutrition plan  . Maintain proper daily exercise and movement  . Maintaining proper use of your device can also help improve management of other chronic illnesses such as: Blood pressure, blood sugars, and weight management.   BiPAP/ CPAP Cleaning:  >>>Clean weekly, with Dawn soap, and bottle brush.  Set up to air dry.      [facility] >>>Contact number for them is Dimas Chyle [phone] or [phone] ext [phone]       Coronavirus (COVID-19) Are you at risk?  Are you at risk for the Coronavirus (COVID-19)?  To be considered HIGH RISK for Coronavirus (COVID-19), you have to meet the following criteria:  . Traveled to [place], [place], [place], [place] or [place]; or in the [place] to [place], [place], [place], or [place]; and have fever, cough, and shortness of breath within the last 2 weeks of travel OR . Been in close contact with a person diagnosed with COVID-19 within the last 2 weeks and have fever, cough, and shortness of breath . IF YOU DO NOT MEET THESE CRITERIA, YOU ARE CONSIDERED LOW RISK FOR COVID-19.  What to do if you are HIGH RISK for COVID-19?  Marland Kitchen If you are having a medical emergency, call 911. . Seek medical care right away. Before you go to a doctor's office, urgent care or emergency department, call  ahead and tell them about your recent travel, contact with someone diagnosed with COVID-19, and your symptoms. You should receive instructions from your physician's office regarding next steps of care.  . When you arrive at healthcare provider, tell the healthcare staff immediately you have returned from visiting [place], [place], [place], [place] or [place]; or traveled in the [place] to [place], [place], [place], or [place]; in the last two weeks or you have been in close contact with a person diagnosed with COVID-19 in the last 2 weeks.   . Tell the health care staff about your symptoms: fever, cough and shortness of breath. . After you have been seen by a medical provider, you will be either: o Tested for (COVID-19) and discharged home on quarantine except to seek medical care if symptoms worsen, and asked to  - Stay home and avoid contact with others until you get your results (4-5 days)  - Avoid travel on public transportation if possible (such as bus, train, or airplane) or o Sent to the Emergency Department by EMS for evaluation, COVID-19 testing, and possible admission depending on your condition and test results.  What to do if you are LOW RISK for COVID-19?  Reduce your risk of any infection by using the same precautions used for avoiding the common cold or flu:  Marland Kitchen Wash your hands often with soap and warm water  for at least 20 seconds.  If soap and water are not readily available, use an alcohol-based hand sanitizer with at least 60% alcohol.  . If coughing or sneezing, cover your mouth and nose by coughing or sneezing into the elbow areas of your shirt or coat, into a tissue or into your sleeve (not your hands). . Avoid shaking hands with others and consider head nods or verbal greetings only. . Avoid touching your eyes, nose, or mouth with unwashed hands.  . Avoid close contact with people who are sick. . Avoid places or events with large numbers of people in one location,  like concerts or sporting events. . Carefully consider travel plans you have or are making. . If you are planning any travel outside or inside the [place], visit the CDC's Travelers' Health webpage for the latest health notices. . If you have some symptoms but not all symptoms, continue to monitor at home and seek medical attention if your symptoms worsen. . If you are having a medical emergency, call 911.   [place] / e-Visit: [email or web address]         [facility]: [facility]: [phone]                   [facility]: [phone]           It is flu season:   >>> Best ways to protect herself from the flu: Receive the yearly flu vaccine, practice good hand hygiene washing with soap and also using hand sanitizer when available, eat a nutritious meals, get adequate rest, hydrate appropriately   Please contact the office if your symptoms worsen or you have concerns that you are not improving.   Thank you for choosing [facility] for your healthcare, and for allowing [place] to partner with you on your healthcare journey. I am thankful to be able to provide care to you today.   Brett Quaker FNP-C   CPAP and BPAP Information CPAP and BPAP are methods of helping a person breathe with the use of air pressure. CPAP stands for "continuous positive airway pressure." BPAP stands for "bi-level positive airway pressure." In both methods, air is blown through your nose or mouth and into your air passages to help you breathe well. CPAP and BPAP use different amounts of pressure to blow air. With CPAP, the amount of pressure stays the same while you breathe in and out. With BPAP, the amount of pressure is increased when you breathe in (inhale) so that you can take larger breaths. Your health care provider will recommend whether CPAP or  BPAP would be more helpful for you. Why are CPAP and BPAP treatments used? CPAP or BPAP can be helpful if you have:  Sleep apnea.  Chronic obstructive pulmonary disease (COPD).  Heart failure.  Medical conditions that weaken the muscles of the chest including muscular dystrophy, or neurological diseases such as amyotrophic lateral sclerosis (ALS).  Other problems that cause breathing to be weak, abnormal, or difficult. CPAP is most commonly used for obstructive sleep apnea (OSA) to keep the airways from collapsing when the muscles relax during sleep. How is CPAP or BPAP administered? Both CPAP and BPAP are provided by a small machine with a flexible plastic tube that attaches to a plastic mask. You wear the mask. Air is blown through the mask into your nose or mouth. The amount of pressure that is used  to blow the air can be adjusted on the machine. Your health care provider will determine the pressure setting that should be used based on your individual needs. When should CPAP or BPAP be used? In most cases, the mask only needs to be worn during sleep. Generally, the mask needs to be worn throughout the night and during any daytime naps. People with certain medical conditions may also need to wear the mask at other times when they are awake. Follow instructions from your health care provider about when to use the machine. What are some tips for using the mask?   Because the mask needs to be snug, some people feel trapped or closed-in (claustrophobic) when first using the mask. If you feel this way, you may need to get used to the mask. One way to do this is by holding the mask loosely over your nose or mouth and then gradually applying the mask more snugly. You can also gradually increase the amount of time that you use the mask.  Masks are available in various types and sizes. Some fit over your mouth and nose while others fit over just your nose. If your mask does not fit well, talk with  your health care provider about getting a different one.  If you are using a mask that fits over your nose and you tend to breathe through your mouth, a chin strap may be applied to help keep your mouth closed.  The CPAP and BPAP machines have alarms that may sound if the mask comes off or develops a leak.  If you have trouble with the mask, it is very important that you talk with your health care provider about finding a way to make the mask easier to tolerate. Do not stop using the mask. Stopping the use of the mask could have a negative impact on your health. What are some tips for using the machine?  Place your CPAP or BPAP machine on a secure table or stand near an electrical outlet.  Know where the on/off switch is located on the machine.  Follow instructions from your health care provider about how to set the pressure on your machine and when you should use it.  Do not eat or drink while the CPAP or BPAP machine is on. Food or fluids could get pushed into your lungs by the pressure of the CPAP or BPAP.  Do not smoke. Tobacco smoke residue can damage the machine.  For home use, CPAP and BPAP machines can be rented or purchased through home health care companies. Many different brands of machines are available. Renting a machine before purchasing may help you find out which particular machine works well for you.  Keep the CPAP or BPAP machine and attachments clean. Ask your health care provider for specific instructions. Get help right away if:  You have redness or open areas around your nose or mouth where the mask fits.  You have trouble using the CPAP or BPAP machine.  You cannot tolerate wearing the CPAP or BPAP mask.  You have pain, discomfort, and bloating in your abdomen. Summary  CPAP and BPAP are methods of helping a person breathe with the use of air pressure.  Both CPAP and BPAP are provided by a small machine with a flexible plastic tube that attaches to a plastic  mask.  If you have trouble with the mask, it is very important that you talk with your health care provider about finding a way to make the  mask easier to tolerate. This information is not intended to replace advice given to you by your health care provider. Make sure you discuss any questions you have with your health care provider. Document Released: 01/24/2004 Document Revised: 12/28/2017 Document Reviewed: 03/16/2016 Elsevier Interactive Patient Education  2019 [employer].

## 2018-08-05 NOTE — Progress Notes (Signed)
  Chief Complaint  Patient presents with  . Nasal Congestion    x3 weeks. Has not gone away since first visit   . Cough   Interactive audio and video telecommunications were attempted between this provider and patient, however failed, due to patient having technical difficulties OR patient did not have access to video capability. We continued and completed visit with audio only.   Virtual Visit via Telephone Note  I connected with Brett Schaefer on 08/05/18 at 11:00 AM EDT by telephone and verified that I am speaking with the correct person using two identifiers.   I discussed the limitations, risks, security and privacy concerns of performing an evaluation and management service by telephone and the availability of in person appointments. I also discussed with the patient that there may be a patient responsible charge related to this service. The patient expressed understanding and agreed to proceed.   HPI:  Patient was seen 07/11/2018 and treated for sinus infection with doxycycline 100 mg twice daily and Flonase nasal spray.  Patient reports his cough has not improved, it is more dry now.  It still occurs every day.  He is reporting increased sinus pressure and pain around his eyes and ears.  He is feeling more nasal congestion.  He has finished the doxycycline, is still using the Flonase and also using nasal saline.  States that every afternoon right around 3 PM he feels achy and has flulike symptoms.  He has also had an occasional episode of vertigo.  He denies any fever, chills, nausea, vomit, chest tightness or shortness of breath.  Observations/Objective: Gen: Afebril reported. No acute distress.  Sounds nasal congested.  Able to complete full sentences without short of breath. Chest: No cough present during conversation. Neuro:  Alert. Oriented x3  Assessment and Plan: Acute recurrent maxillary sinusitis-persistent -Very little improvement after doxycycline course completed.   Patient to continue Mucinex, over-the-counter cough suppressants.  Start prednisone taper and Z-Pak. -Continue Flonase and start over-the-counter Zyrtec nightly. - predniSONE (DELTASONE) 20 MG tablet; 60 mg x3d, 40 mg x3d, 20 mg x2d, 10 mg x2d  Dispense: 18 tablet; Refill: 0 - azithromycin (ZITHROMAX) 250 MG tablet; 500 mg day 1, 250 mg QD  Dispense: 6 tablet; Refill: 0   Follow Up Instructions: PRN    I discussed the assessment and treatment plan with the patient. The patient was provided an opportunity to ask questions and all were answered. The patient agreed with the plan and demonstrated an understanding of the instructions.   The patient was advised to call back or seek an in-person evaluation if the symptoms worsen or if the condition fails to improve as anticipated.  I provided 13 minutes of non-face-to-face time during this encounter.  Interactive audio and video telecommunications were attempted between this provider and patient, however failed, due to patient having technical difficulties OR patient did not have access to video capability. We continued and completed visit with audio only.    Howard Pouch, DO

## 2018-08-05 NOTE — Addendum Note (Signed)
Addended by: Joella Prince on: 08/05/2018 01:59 PM   Modules accepted: Orders

## 2018-08-05 NOTE — Patient Instructions (Signed)
telehealth

## 2018-08-05 NOTE — Assessment & Plan Note (Signed)
Assessment: Severe obstructive sleep apnea 2018 home sleep study showing AHI of 58.7 Patient reports no current issues with CPAP except wants to have one replaced Last weight in chart 284 pounds  Plan: Order placed for new [facility] Telephonic outreach in 10 weeks

## 2018-08-05 NOTE — Progress Notes (Signed)
Virtual Visit via Telephone Note  I connected with Brett Schaefer on 08/05/18 at  2:30 PM EDT by telephone and verified that I am speaking with the correct person using two identifiers.   I discussed the limitations, risks, security and privacy concerns of performing an evaluation and management service by telephone and the availability of in person appointments. I also discussed with the patient that there may be a patient responsible charge related to this service. The patient expressed understanding and agreed to proceed.   History of Present Illness:  Patient consented to consult via telephone: Yes People present and their role in pt care: Pt  Chief complaint: OSA   48 year old male followed in our office for obstructive sleep apnea.  2018 home sleep study shows an AHI of 58.7.  He is followed by Dr. Halford Chessman.  He was last seen in our office in 2018.  Patient contacted our office yesterday to receive an order for a new CPAP as he reports that CPAP that he is currently using is 46 years old.  In 2018 we had sent an order for new [facility]AP [facility] never picked it up.  He reports he has no issues using his CPAP.  He reports he is currently using nasal pillows but he just ordered a fullface mask.  He reports no issues with supplies.  He would like this order sent to a DME company such [facility].  Patient reports that his weight remains around 284 pounds which is lost weight in the chart.  Observations/Objective:  HST 11/20/16 >> AHI 58.7, SaO2 low 76% CPAP titration 01/28/17 >> CPAP 9 cm H2O  Assessment and Plan:  OSA on CPAP Assessment: Severe obstructive sleep apnea 2018 home sleep study showing AHI of 58.7 Patient reports no current issues with CPAP except wants to have one replaced Last weight in chart 284 pounds  Plan: Order placed for new [facility]AP [facility] Telephonic outreach in 10 weeks   Follow Up  Instructions:  Telephonic outreach in 10 weeks after starting new CPAP with download   I discussed the assessment and treatment plan with the patient. The patient was provided an opportunity to ask questions and all were answered. The patient agreed with the plan and demonstrated an understanding of the instructions.   The patient was advised to call back or seek an in-person evaluation if the symptoms worsen or if the condition fails to improve as anticipated.  I provided 22 minutes of non-face-to-face time during this encounter.   Lauraine Rinne, NP

## 2018-08-08 ENCOUNTER — Other Ambulatory Visit: Payer: Self-pay | Admitting: General Surgery

## 2018-08-08 ENCOUNTER — Telehealth: Payer: Self-pay | Admitting: Pulmonary Disease

## 2018-08-08 ENCOUNTER — Encounter: Payer: Self-pay | Admitting: General Surgery

## 2018-08-08 DIAGNOSIS — Z9989 Dependence on other enabling machines and devices: Secondary | ICD-10-CM

## 2018-08-08 DIAGNOSIS — G4733 Obstructive sleep apnea (adult) (pediatric): Secondary | ICD-10-CM

## 2018-08-08 NOTE — Telephone Encounter (Signed)
Called [facility], asked for Cricket. The rep that picked up the phone (Darlene or Daphine) I told her that the order was placed on Friday for a new machine since the patient never did pick up the machine that was ordered back in 2018 and the setting based on the OV note was to be at 9.  She initially put the call on hold, then stated she would send an email to Rodney's department to let them know. Then stated I needed to resubmit the order placed on Friday with the pressure setting, because they needed to have it in writing. Although Barbaraann Rondo called asking for the setting, which implies he could have taken that information over the phone.  Order resubmitted.

## 2018-08-29 DIAGNOSIS — G4733 Obstructive sleep apnea (adult) (pediatric): Secondary | ICD-10-CM | POA: Diagnosis not present

## 2018-09-28 DIAGNOSIS — G4733 Obstructive sleep apnea (adult) (pediatric): Secondary | ICD-10-CM | POA: Diagnosis not present

## 2018-10-10 NOTE — Progress Notes (Signed)
Virtual Visit via Telephone Note  I connected with Brett Schaefer on 10/11/18 at  2:00 PM EDT by telephone and verified that I am speaking with the correct person using two identifiers.  Location: Patient: Home Provider: [facility] - [address], [place], [place], [place] [address]   I discussed the limitations, risks, security and privacy concerns of performing an evaluation and management service by telephone and the availability of in person appointments. I also discussed with the patient that there may be a patient responsible charge related to this service. The patient expressed understanding and agreed to proceed.  Patient consented to consult via telephone: Yes People present and their role in pt care: Pt    History of Present Illness: 48 year old male followed in our office for obstructive sleep apnea.  2018 home sleep study shows an AHI of 58.7.   He is followed by Dr. Halford Chessman.   Chief complaint: OSA   48 year old male followed in our office for obstructive sleep apnea.  Patient is started new CPAP and reports that things are going well.  Patient has been managed on CPAP therapy for the last 15 years.  Patient reports he also has purchased a so clean system and so far is enjoying it.  Patient CPAP compliance report today shows excellent compliance.  See CPAP, compliance report listed below:  09/10/2018-10/09/2018- 30 had a last 30 days use, all 30 those days greater than 4 hours, 8 hours and 19 minutes, CPAP set pressure of 10, AHI 1.5   Observations/Objective:  HST 11/20/16 >> AHI 58.7, SaO2 low 76% CPAP titration 01/28/17 >> CPAP 9 cm H2O   Assessment and Plan:  OSA on CPAP Assessment: Severe obstructive sleep apnea on 2018 home sleep study showing AHI of 58.7 Patient reports no current issues with new CPAP CPAP compliance report today shows excellent compliance  Plan: Continue CPAP therapy Follow-up with our office in 1 year   Follow Up  Instructions:  Return in about 1 year (around 10/11/2019), or if symptoms worsen or fail to improve, for Follow up with Dr. Halford Chessman, Follow up with Wyn Quaker FNP-C.   I discussed the assessment and treatment plan with the patient. The patient was provided an opportunity to ask questions and all were answered. The patient agreed with the plan and demonstrated an understanding of the instructions.   The patient was advised to call back or seek an in-person evaluation if the symptoms worsen or if the condition fails to improve as anticipated.  I provided 14 minutes of non-face-to-face time during this encounter.   Lauraine Rinne, NP

## 2018-10-11 ENCOUNTER — Encounter: Payer: Self-pay | Admitting: Pulmonary Disease

## 2018-10-11 ENCOUNTER — Ambulatory Visit (INDEPENDENT_AMBULATORY_CARE_PROVIDER_SITE_OTHER): Payer: BC Managed Care – PPO | Admitting: Pulmonary Disease

## 2018-10-11 ENCOUNTER — Other Ambulatory Visit: Payer: Self-pay

## 2018-10-11 DIAGNOSIS — Z9989 Dependence on other enabling machines and devices: Secondary | ICD-10-CM | POA: Diagnosis not present

## 2018-10-11 DIAGNOSIS — G4733 Obstructive sleep apnea (adult) (pediatric): Secondary | ICD-10-CM | POA: Diagnosis not present

## 2018-10-11 NOTE — Assessment & Plan Note (Signed)
Assessment: Severe obstructive sleep apnea on 2018 home sleep study showing AHI of 58.7 Patient reports no current issues with new CPAP CPAP compliance report today shows excellent compliance  Plan: Continue CPAP therapy Follow-up with our office in 1 year

## 2018-10-11 NOTE — Patient Instructions (Addendum)
We recommend that you continue using your CPAP daily >>>Keep up the hard work using your device >>> Goal should be wearing this for the entire night that you are sleeping, at least 4 to 6 hours  Remember:   Do not drive or operate heavy machinery if tired or drowsy.   Please notify the supply company and office if you are unable to use your device regularly due to missing supplies or machine being broken.   Work on maintaining a healthy weight and following your recommended nutrition plan   Maintain proper daily exercise and movement   Maintaining proper use of your device can also help improve management of other chronic illnesses such as: Blood pressure, blood sugars, and weight management.   BiPAP/ CPAP Cleaning:  >>>Clean weekly, with Dawn soap, and bottle brush.  Set up to air dry.   Return in about 1 year (around 10/11/2019), or if symptoms worsen or fail to improve, for Follow up with Dr. Halford Chessman, Follow up with Wyn Quaker FNP-C.   Coronavirus (COVID-19) Are you at risk?  Are you at risk for the Coronavirus (COVID-19)?  To be considered HIGH RISK for Coronavirus (COVID-19), you have to meet the following criteria:  . Traveled to [place], [place], [place], [place] or [place]; or in the [place] to [place], [place], [place], or [place]; and have fever, cough, and shortness of breath within the last 2 weeks of travel OR . Been in close contact with a person diagnosed with COVID-19 within the last 2 weeks and have fever, cough, and shortness of breath . IF YOU DO NOT MEET THESE CRITERIA, YOU ARE CONSIDERED LOW RISK FOR COVID-19.  What to do if you are HIGH RISK for COVID-19?  Marland Kitchen If you are having a medical emergency, call 911. . Seek medical care right away. Before you go to a doctor's office, urgent care or emergency department, call ahead and tell them about your recent travel, contact with someone diagnosed with COVID-19, and your symptoms. You should receive  instructions from your physician's office regarding next steps of care.  . When you arrive at healthcare provider, tell the healthcare staff immediately you have returned from visiting [place], [place], [place], [place] or [place]; or traveled in the [place] to [place], [place], [place], or [place]; in the last two weeks or you have been in close contact with a person diagnosed with COVID-19 in the last 2 weeks.   . Tell the health care staff about your symptoms: fever, cough and shortness of breath. . After you have been seen by a medical provider, you will be either: o Tested for (COVID-19) and discharged home on quarantine except to seek medical care if symptoms worsen, and asked to  - Stay home and avoid contact with others until you get your results (4-5 days)  - Avoid travel on public transportation if possible (such as bus, train, or airplane) or o Sent to the Emergency Department by EMS for evaluation, COVID-19 testing, and possible admission depending on your condition and test results.  What to do if you are LOW RISK for COVID-19?  Reduce your risk of any infection by using the same precautions used for avoiding the common cold or flu:  Marland Kitchen Wash your hands often with soap and warm water for at least 20 seconds.  If soap and water are not readily available, use an alcohol-based hand sanitizer with at least 60% alcohol.  . If coughing or sneezing,  cover your mouth and nose by coughing or sneezing into the elbow areas of your shirt or coat, into a tissue or into your sleeve (not your hands). . Avoid shaking hands with others and consider head nods or verbal greetings only. . Avoid touching your eyes, nose, or mouth with unwashed hands.  . Avoid close contact with people who are sick. . Avoid places or events with large numbers of people in one location, like concerts or sporting events. . Carefully consider travel plans you have or are making. . If you are planning any travel  outside or inside the [place], visit the CDC's Travelers' Health webpage for the latest health notices. . If you have some symptoms but not all symptoms, continue to monitor at home and seek medical attention if your symptoms worsen. . If you are having a medical emergency, call 911.   [place] / e-Visit: [email or web address]         [facility]: [facility]: [phone]                   [facility]: [phone]           It is flu season:   >>> Best ways to protect herself from the flu: Receive the yearly flu vaccine, practice good hand hygiene washing with soap and also using hand sanitizer when available, eat a nutritious meals, get adequate rest, hydrate appropriately   Please contact the office if your symptoms worsen or you have concerns that you are not improving.   Thank you for choosing [facility] for your healthcare, and for allowing [place] to partner with you on your healthcare journey. I am thankful to be able to provide care to you today.   Wyn Quaker FNP-C     Living With Sleep Apnea Sleep apnea is a condition in which breathing pauses or becomes shallow during sleep. Sleep apnea is most commonly caused by a collapsed or blocked airway. People with sleep apnea snore loudly and have times when they gasp and stop breathing for 10 seconds or more during sleep. This happens over and over during the night. This disrupts your sleep and keeps your body from getting the rest that it needs, which can cause tiredness and lack of energy (fatigue) during the day. The breaks in breathing also interrupt the deep sleep that you need to feel rested. Even if you do not completely wake up from the gaps in breathing, your sleep may not be restful. You may also have a headache in the morning and low energy during the day, and you  may feel anxious or depressed. How can sleep apnea affect me? Sleep apnea increases your chances of extreme tiredness during the day (daytime fatigue). It can also increase your risk for health conditions, such as:  Heart attack.  Stroke.  Diabetes.  Heart failure.  Irregular heartbeat.  High blood pressure. If you have daytime fatigue as a result of sleep apnea, you may be more likely to:  Perform poorly at school or work.  Fall asleep while driving.  Have difficulty with attention.  Develop depression or anxiety.  Become severely overweight (obese).  Have sexual dysfunction. What actions can I take to manage sleep apnea? Sleep apnea treatment   If you were given a device to open your airway while you sleep, use it only as told by your health care provider. You may be  given: ? An oral appliance. This is a custom-made mouthpiece that shifts your lower jaw forward. ? A continuous positive airway pressure (CPAP) device. This device blows air through a mask when you breathe out (exhale). ? A nasal expiratory positive airway pressure (EPAP) device. This device has valves that you put into each nostril. ? A bi-level positive airway pressure (BPAP) device. This device blows air through a mask when you breathe in (inhale) and breathe out (exhale).  You may need surgery if other treatments do not work for you. Sleep habits  Go to sleep and wake up at the same time every day. This helps set your internal clock (circadian rhythm) for sleeping. ? If you stay up later than usual, such as on weekends, try to get up in the morning within 2 hours of your normal wake time.  Try to get at least 7-9 hours of sleep each night.  Stop computer, tablet, and mobile phone use a few hours before bedtime.  Do not take long naps during the day. If you nap, limit it to 30 minutes.  Have a relaxing bedtime routine. Reading or listening to music may relax you and help you sleep.  Use your  bedroom only for sleep. ? Keep your television and computer out of your bedroom. ? Keep your bedroom cool, dark, and quiet. ? Use a supportive mattress and pillows.  Follow your health care provider's instructions for other changes to sleep habits. Nutrition  Do not eat heavy meals in the evening.  Do not have caffeine in the later part of the day. The effects of caffeine can last for more than 5 hours.  Follow your health care provider's or dietitian's instructions for any diet changes. Lifestyle      Do not drink alcohol before bedtime. Alcohol can cause you to fall asleep at first, but then it can cause you to wake up in the middle of the night and have trouble getting back to sleep.  Do not use any products that contain nicotine or tobacco, such as cigarettes and e-cigarettes. If you need help quitting, ask your health care provider. Medicines  Take over-the-counter and prescription medicines only as told by your health care provider.  Do not use over-the-counter sleep medicine. You can become dependent on this medicine, and it can make sleep apnea worse.  Do not use medicines, such as sedatives and narcotics, unless told by your health care provider. Activity  Exercise on most days, but avoid exercising in the evening. Exercising near bedtime can interfere with sleeping.  If possible, spend time outside every day. Natural light helps regulate your circadian rhythm. General information  Lose weight if you need to, and maintain a healthy weight.  Keep all follow-up visits as told by your health care provider. This is important.  If you are having surgery, make sure to tell your health care provider that you have sleep apnea. You may need to bring your device with you. Where to find more information Learn more about sleep apnea and daytime fatigue from:  American Sleep Association: sleepassociation.[place]: sleepfoundation.org  National Heart,  Lung, and Blood Institute: https://www.[place].biz/ Summary  Sleep apnea can cause daytime fatigue and other serious health conditions.  Both sleep apnea and daytime fatigue can be bad for your health and well-being.  You may need to wear a device while sleeping to help keep your airway open.  If you are having surgery, make sure to tell your health care provider  that you have sleep apnea. You may need to bring your device with you.  Making changes to sleep habits, diet, lifestyle, and activity can help you manage sleep apnea. This information is not intended to replace advice given to you by your health care provider. Make sure you discuss any questions you have with your health care provider. Document Released: 07/22/2017 Document Revised: 12/28/2017 Document Reviewed: 07/22/2017 Elsevier Interactive Patient Education  2019 [employer].     CPAP and BPAP Information CPAP and BPAP are methods of helping a person breathe with the use of air pressure. CPAP stands for "continuous positive airway pressure." BPAP stands for "bi-level positive airway pressure." In both methods, air is blown through your nose or mouth and into your air passages to help you breathe well. CPAP and BPAP use different amounts of pressure to blow air. With CPAP, the amount of pressure stays the same while you breathe in and out. With BPAP, the amount of pressure is increased when you breathe in (inhale) so that you can take larger breaths. Your health care provider will recommend whether CPAP or BPAP would be more helpful for you. Why are CPAP and BPAP treatments used? CPAP or BPAP can be helpful if you have:  Sleep apnea.  Chronic obstructive pulmonary disease (COPD).  Heart failure.  Medical conditions that weaken the muscles of the chest including muscular dystrophy, or neurological diseases such as amyotrophic lateral sclerosis (ALS).  Other problems that cause breathing to be weak, abnormal, or difficult. CPAP is  most commonly used for obstructive sleep apnea (OSA) to keep the airways from collapsing when the muscles relax during sleep. How is CPAP or BPAP administered? Both CPAP and BPAP are provided by a small machine with a flexible plastic tube that attaches to a plastic mask. You wear the mask. Air is blown through the mask into your nose or mouth. The amount of pressure that is used to blow the air can be adjusted on the machine. Your health care provider will determine the pressure setting that should be used based on your individual needs. When should CPAP or BPAP be used? In most cases, the mask only needs to be worn during sleep. Generally, the mask needs to be worn throughout the night and during any daytime naps. People with certain medical conditions may also need to wear the mask at other times when they are awake. Follow instructions from your health care provider about when to use the machine. What are some tips for using the mask?   Because the mask needs to be snug, some people feel trapped or closed-in (claustrophobic) when first using the mask. If you feel this way, you may need to get used to the mask. One way to do this is by holding the mask loosely over your nose or mouth and then gradually applying the mask more snugly. You can also gradually increase the amount of time that you use the mask.  Masks are available in various types and sizes. Some fit over your mouth and nose while others fit over just your nose. If your mask does not fit well, talk with your health care provider about getting a different one.  If you are using a mask that fits over your nose and you tend to breathe through your mouth, a chin strap may be applied to help keep your mouth closed.  The CPAP and BPAP machines have alarms that may sound if the mask comes off or develops a leak.  If you have trouble with the mask, it is very important that you talk with your health care provider about finding a way to make  the mask easier to tolerate. Do not stop using the mask. Stopping the use of the mask could have a negative impact on your health. What are some tips for using the machine?  Place your CPAP or BPAP machine on a secure table or stand near an electrical outlet.  Know where the on/off switch is located on the machine.  Follow instructions from your health care provider about how to set the pressure on your machine and when you should use it.  Do not eat or drink while the CPAP or BPAP machine is on. Food or fluids could get pushed into your lungs by the pressure of the CPAP or BPAP.  Do not smoke. Tobacco smoke residue can damage the machine.  For home use, CPAP and BPAP machines can be rented or purchased through home health care companies. Many different brands of machines are available. Renting a machine before purchasing may help you find out which particular machine works well for you.  Keep the CPAP or BPAP machine and attachments clean. Ask your health care provider for specific instructions. Get help right away if:  You have redness or open areas around your nose or mouth where the mask fits.  You have trouble using the CPAP or BPAP machine.  You cannot tolerate wearing the CPAP or BPAP mask.  You have pain, discomfort, and bloating in your abdomen. Summary  CPAP and BPAP are methods of helping a person breathe with the use of air pressure.  Both CPAP and BPAP are provided by a small machine with a flexible plastic tube that attaches to a plastic mask.  If you have trouble with the mask, it is very important that you talk with your health care provider about finding a way to make the mask easier to tolerate. This information is not intended to replace advice given to you by your health care provider. Make sure you discuss any questions you have with your health care provider. Document Released: 01/24/2004 Document Revised: 12/28/2017 Document Reviewed: 03/16/2016 Elsevier  Interactive Patient Education  2019 [employer].

## 2018-10-29 DIAGNOSIS — G4733 Obstructive sleep apnea (adult) (pediatric): Secondary | ICD-10-CM | POA: Diagnosis not present

## 2018-11-24 NOTE — Progress Notes (Signed)
Reviewed and agree with assessment/plan.   Lexey Fletes, MD [facility] 05/06/2016, 12:24 PM Pager:  [phone]  

## 2018-11-28 DIAGNOSIS — G4733 Obstructive sleep apnea (adult) (pediatric): Secondary | ICD-10-CM | POA: Diagnosis not present

## 2018-12-27 NOTE — Progress Notes (Signed)
Reviewed and agree with assessment/plan.   Catalea Labrecque, MD [facility] 05/06/2016, 12:24 PM Pager:  [phone]  

## 2018-12-29 DIAGNOSIS — G4733 Obstructive sleep apnea (adult) (pediatric): Secondary | ICD-10-CM | POA: Diagnosis not present

## 2019-01-15 DIAGNOSIS — J019 Acute sinusitis, unspecified: Secondary | ICD-10-CM | POA: Diagnosis not present

## 2019-01-29 DIAGNOSIS — G4733 Obstructive sleep apnea (adult) (pediatric): Secondary | ICD-10-CM | POA: Diagnosis not present

## 2019-02-28 DIAGNOSIS — G4733 Obstructive sleep apnea (adult) (pediatric): Secondary | ICD-10-CM | POA: Diagnosis not present

## 2019-03-31 DIAGNOSIS — G4733 Obstructive sleep apnea (adult) (pediatric): Secondary | ICD-10-CM | POA: Diagnosis not present

## 2019-04-30 DIAGNOSIS — G4733 Obstructive sleep apnea (adult) (pediatric): Secondary | ICD-10-CM | POA: Diagnosis not present

## 2019-05-16 ENCOUNTER — Encounter: Payer: Self-pay | Admitting: Internal Medicine

## 2019-08-01 ENCOUNTER — Ambulatory Visit (INDEPENDENT_AMBULATORY_CARE_PROVIDER_SITE_OTHER): Payer: BC Managed Care – PPO | Admitting: Internal Medicine

## 2019-08-01 ENCOUNTER — Other Ambulatory Visit: Payer: Self-pay

## 2019-08-01 ENCOUNTER — Encounter: Payer: Self-pay | Admitting: Internal Medicine

## 2019-08-01 VITALS — Ht 73.0 in

## 2019-08-01 DIAGNOSIS — J339 Nasal polyp, unspecified: Secondary | ICD-10-CM | POA: Diagnosis not present

## 2019-08-01 DIAGNOSIS — J019 Acute sinusitis, unspecified: Secondary | ICD-10-CM | POA: Diagnosis not present

## 2019-08-01 MED ORDER — AZITHROMYCIN 250 MG PO TABS: ORAL_TABLET | ORAL | 0 refills | Status: DC

## 2019-08-01 MED ORDER — PREDNISONE 10 MG PO TABS: ORAL_TABLET | ORAL | 0 refills | Status: DC

## 2019-08-01 NOTE — Progress Notes (Signed)
Subjective:    Patient ID: Brett Schaefer, male    DOB: [birth year], 49 y.o.   MRN: [id number]  DOS:  08/01/2019 Type of visit - description: Virtual Visit via Video Note  I connected with the above patient  by a video enabled telemedicine application and verified that I am speaking with the correct person using two identifiers.   THIS ENCOUNTER IS A VIRTUAL VISIT DUE TO COVID-19 - PATIENT WAS NOT SEEN IN THE OFFICE. PATIENT HAS CONSENTED TO VIRTUAL VISIT / TELEMEDICINE VISIT   Location of patient: home  Location of provider: office  I discussed the limitations of evaluation and management by telemedicine and the availability of in person appointments. The patient expressed understanding and agreed to proceed.  Acute For the last approximately 2 weeks has noted that his right airway passage has been blocked, this is consistent with his previous history of a nasal polyp.  He has some sinus congestion particularly on the right side.  Review of Systems  Denies fever chills No sore throat or runny nose per se just a blockage at  the right nostril.  Past Medical History:  Diagnosis Date  . Allergy   . Fever   . GERD (gastroesophageal reflux disease)   . Heart murmur   . Hx of adenomatous colonic polyps 04/16/2016  . Kidney stone   . Migraines   . OSA on CPAP     Past Surgical History:  Procedure Laterality Date  . LUMBAR LAMINECTOMY/DECOMPRESSION MICRODISCECTOMY Left 01/04/2013   Procedure: MICRO LUMBAR DECOMPRESSION L4-S1 LEFT    (2 LEVELS)Discectomy at L4-L5;  Surgeon: Johnn Hai, MD;  Location: [facility];  Service: Orthopedics;  Laterality: Left;  . LUMBAR LAMINECTOMY/DECOMPRESSION MICRODISCECTOMY Left 12/21/2013   Procedure: REVISION L4 - L5 LEFT DISCECTOMY 1 LEVEL;  Surgeon: Melina Schools, MD;  Location: [facility];  Service: Orthopedics;  Laterality: Left;  . TONSILLECTOMY      Social History   Socioeconomic History  . Marital status: Married    Spouse name: [name]    . Number of children: 3  . Years of education: 39  . Highest education level: Not on file  Occupational History  . Occupation: Corporate treasurer  Tobacco Use  . Smoking status: Former Smoker    Packs/day: 1.00    Years: 5.00    Pack years: 5.00    Types: Cigarettes    Quit date: 05/11/1996    Years since quitting: 23.2  . Smokeless tobacco: Never Used  Substance and Sexual Activity  . Alcohol use: Yes    Comment: occasional  . Drug use: No  . Sexual activity: Yes    Partners: Female    Comment: married  Other Topics Concern  . Not on file  Social History Narrative   married to [place], 3 children.   Graphic designer, BA degree.    Caffeine use.    Wears seatbelt, bicycle helmet.    Smoke detector in the home.    Feels safe in relationships.    Social Determinants of Health   Financial Resource Strain:   . Difficulty of Paying Living Expenses:   Food Insecurity:   . Worried About Charity fundraiser in the Last Year:   . Arboriculturist in the Last Year:   Transportation Needs:   . Film/video editor (Medical):   Marland Kitchen Lack of Transportation (Non-Medical):   Physical Activity:   . Days of Exercise per Week:   . Minutes of Exercise per  Session:   Stress:   . Feeling of Stress :   Social Connections:   . Frequency of Communication with Friends and Family:   . Frequency of Social Gatherings with Friends and Family:   . Attends Religious Services:   . Active Member of Clubs or Organizations:   . Attends Archivist Meetings:   Marland Kitchen Marital Status:   Intimate Partner Violence:   . Fear of Current or Ex-Partner:   . Emotionally Abused:   Marland Kitchen Physically Abused:   . Sexually Abused:       Allergies as of 08/01/2019      Reactions   Aspirin Hives   Ibuprofen Hives   Penicillins Hives      Medication List       Accurate as of August 01, 2019 11:59 PM. If you have any questions, ask your nurse or doctor.        azithromycin 250 MG tablet Commonly known  as: Zithromax Z-Pak 2 tabs a day the first day, then 1 tab a day x 4 days What changed: additional instructions Changed by: Kathlene November, MD   fluticasone 50 MCG/ACT nasal spray Commonly known as: FLONASE Place 2 sprays into both nostrils daily.   predniSONE 10 MG tablet Commonly known as: DELTASONE 3 tabs x 3 days, 2 tabs x 3 days, 1 tab x 3 days What changed:   medication strength  additional instructions Changed by: Kathlene November, MD          Objective:   Physical Exam Ht 6\' 1"  (1.854 m)   BMI 37.52 kg/m  This is a virtual video visit, he is alert oriented x3, in no distress, speaking in complete sentences.  Voice sounds somewhat nasal.  Face is symmetric.    Assessment    49 year old gentleman, PMH includes sleep apnea with good compliance of CPAP, history of nasal polyps present with:  Sinusitis/rhinitis , possibly nasal polyps: For 2 weeks has a feeling of the right nostril being blocked and sinus congestion worse on the right. Historically, Zithromax and prednisone significantly help. Advise patient that   Zithromax could be a overkill since I am not sure that he has a bacterial sinusitis but at the end we agreed to: ENT referral, nasal polyp? Treat possible sinusitis with Zithromax, prednisone, Flonase. Call if not better.

## 2019-08-01 NOTE — Progress Notes (Signed)
Pre visit review using our clinic review tool, if applicable. No additional management support is needed unless otherwise documented below in the visit note. 

## 2019-08-02 ENCOUNTER — Encounter: Payer: Self-pay | Admitting: Internal Medicine

## 2019-08-11 DIAGNOSIS — M5416 Radiculopathy, lumbar region: Secondary | ICD-10-CM | POA: Diagnosis not present

## 2019-08-11 DIAGNOSIS — Z6835 Body mass index (BMI) 35.0-35.9, adult: Secondary | ICD-10-CM | POA: Diagnosis not present

## 2019-08-11 DIAGNOSIS — M545 Low back pain: Secondary | ICD-10-CM | POA: Diagnosis not present

## 2019-08-22 DIAGNOSIS — M545 Low back pain: Secondary | ICD-10-CM | POA: Diagnosis not present

## 2019-08-25 DIAGNOSIS — M545 Low back pain: Secondary | ICD-10-CM | POA: Diagnosis not present

## 2019-09-05 DIAGNOSIS — M5136 Other intervertebral disc degeneration, lumbar region: Secondary | ICD-10-CM | POA: Diagnosis not present

## 2019-10-31 DIAGNOSIS — M5136 Other intervertebral disc degeneration, lumbar region: Secondary | ICD-10-CM | POA: Diagnosis not present

## 2019-11-25 DIAGNOSIS — M5416 Radiculopathy, lumbar region: Secondary | ICD-10-CM | POA: Diagnosis not present

## 2019-11-25 DIAGNOSIS — M961 Postlaminectomy syndrome, not elsewhere classified: Secondary | ICD-10-CM | POA: Diagnosis not present

## 2020-05-03 DIAGNOSIS — U071 COVID-19: Secondary | ICD-10-CM

## 2020-05-03 HISTORY — DX: COVID-19: U07.1

## 2020-05-14 ENCOUNTER — Other Ambulatory Visit: Payer: Self-pay

## 2020-05-15 ENCOUNTER — Encounter: Payer: Self-pay | Admitting: Family Medicine

## 2020-05-15 ENCOUNTER — Telehealth (INDEPENDENT_AMBULATORY_CARE_PROVIDER_SITE_OTHER): Payer: BC Managed Care – PPO | Admitting: Family Medicine

## 2020-05-15 DIAGNOSIS — U071 COVID-19: Secondary | ICD-10-CM | POA: Diagnosis not present

## 2020-05-15 DIAGNOSIS — J019 Acute sinusitis, unspecified: Secondary | ICD-10-CM | POA: Diagnosis not present

## 2020-05-15 MED ORDER — AZITHROMYCIN 250 MG PO TABS: ORAL_TABLET | ORAL | 0 refills | Status: DC

## 2020-05-15 NOTE — Progress Notes (Signed)
   VIRTUAL VISIT VIA VIDEO  I connected with Brett Schaefer on 05/15/20 at  9:00 AM EST by elemedicine application and verified that I am speaking with the correct person using two identifiers. Location patient: Home Location provider: [facility], Office Persons participating in the virtual visit: Patient, Dr. Claiborne Billings and Paulina Fusi, CMA  I discussed the limitations of evaluation and management by telemedicine and the availability of in person appointments. The patient expressed understanding and agreed to proceed.   SUBJECTIVE Chief Complaint  Patient presents with  . Cough    Pt c/o cough, HA and congestion; dx with COVID 12/24 sx started same day;     HPI: Brett Schaefer is a 50 y.o. male present for covid illness. Diagnosed by rapid test on 12/24- symptoms started the same day as test. He reports head congestion, headache and cough have remained. New sinus pressure and congestion worsening. HIs wife and kids also became infected with covid.  He is taking mucinex Dm and did a prednisone pack.   vaccinated for covid w/ pfizer > 6 mos ago.  ROS: See pertinent positives and negatives per HPI.  Patient Active Problem List   Diagnosis Date Noted  . Acute recurrent maxillary sinusitis 08/05/2018  . Umbilical hernia without obstruction and without gangrene 02/12/2017  . OSA on CPAP 06/08/2016  . Morbid obesity due to excess calories (HCC) 06/08/2016  . Hx of adenomatous colonic polyps 04/16/2016  . HNP (herniated nucleus pulposus), lumbar 01/04/2013    Social History   Tobacco Use  . Smoking status: Former Smoker    Packs/day: 1.00    Years: 5.00    Pack years: 5.00    Types: Cigarettes    Quit date: 05/11/1996    Years since quitting: 24.0  . Smokeless tobacco: Never Used  Substance Use Topics  . Alcohol use: Yes    Comment: occasional    Current Outpatient Medications:  .  azithromycin (ZITHROMAX) 250 MG tablet, 500 mg day 1, then 250 mg Qd x4 days, Disp: 6  tablet, Rfl: 0  Allergies  Allergen Reactions  . Aspirin Hives  . Ibuprofen Hives  . Penicillins Hives    OBJECTIVE: There were no vitals taken for this visit. Gen: No acute distress. Nontoxic in appearance.  HENT: AT. [place].  MMM.  Eyes:Pupils Equal Round Reactive to light, Extraocular movements intact,  Conjunctiva without redness, discharge or icterus Chest: Cough mild- no shortness of breath Skin: no rashes, purpura or petechiae.  Neuro: Alert. Oriented x3  Psych: Normal affect and demeanor. Normal speech. Normal thought content and judgment.  ASSESSMENT AND PLAN: Brett Schaefer is a 50 y.o. male present for  COVID-19/Acute sinusitis, recurrence not specified, unspecified location Rest, hydrate.  +/- flonase, mucinex (DM if cough), nettie pot or nasal saline.  azith prescribed, take until completed.  If cough present it can last up to 6-8 weeks.  Vaccine booster recommended after 8-12 weeks  F/U 2 weeks of not improved.    Felix Pacini, DO 05/15/2020   Return if symptoms worsen or fail to improve.  No orders of the defined types were placed in this encounter.  Meds ordered this encounter  Medications  . azithromycin (ZITHROMAX) 250 MG tablet    Sig: 500 mg day 1, then 250 mg Qd x4 days    Dispense:  6 tablet    Refill:  0   Referral Orders  No referral(s) requested today

## 2020-05-15 NOTE — Patient Instructions (Signed)
COVID-19 COVID-19 is a respiratory infection that is caused by a virus called severe acute respiratory syndrome coronavirus 2 (SARS-CoV-2). The disease is also known as coronavirus disease or novel coronavirus. In some people, the virus may not cause any symptoms. In others, it may cause a serious infection. The infection can get worse quickly and can lead to complications, such as:  Pneumonia, or infection of the lungs.  Acute respiratory distress syndrome or ARDS. This is a condition in which fluid build-up in the lungs prevents the lungs from filling with air and passing oxygen into the blood.  Acute respiratory failure. This is a condition in which there is not enough oxygen passing from the lungs to the body or when carbon dioxide is not passing from the lungs out of the body.  Sepsis or septic shock. This is a serious bodily reaction to an infection.  Blood clotting problems.  Secondary infections due to bacteria or fungus.  Organ failure. This is when your body's organs stop working. The virus that causes COVID-19 is contagious. This means that it can spread from person to person through droplets from coughs and sneezes (respiratory secretions). What are the causes? This illness is caused by a virus. You may catch the virus by:  Breathing in droplets from an infected person. Droplets can be spread by a person breathing, speaking, singing, coughing, or sneezing.  Touching something, like a table or a doorknob, that was exposed to the virus (contaminated) and then touching your mouth, nose, or eyes. What increases the risk? Risk for infection You are more likely to be infected with this virus if you:  Are within 6 feet (2 meters) of a person with COVID-19.  Provide care for or live with a person who is infected with COVID-19.  Spend time in crowded indoor spaces or live in shared housing. Risk for serious illness You are more likely to become seriously ill from the virus if you:   Are 50 years of age or older. The higher your age, the more you are at risk for serious illness.  Live in a nursing home or long-term care facility.  Have cancer.  Have a long-term (chronic) disease such as: ? Chronic lung disease, including chronic obstructive pulmonary disease or asthma. ? A long-term disease that lowers your body's ability to fight infection (immunocompromised). ? Heart disease, including heart failure, a condition in which the arteries that lead to the heart become narrow or blocked (coronary artery disease), a disease which makes the heart muscle thick, weak, or stiff (cardiomyopathy). ? Diabetes. ? Chronic kidney disease. ? Sickle cell disease, a condition in which red blood cells have an abnormal "sickle" shape. ? Liver disease.  Are obese. What are the signs or symptoms? Symptoms of this condition can range from mild to severe. Symptoms may appear any time from 2 to 14 days after being exposed to the virus. They include:  A fever or chills.  A cough.  Difficulty breathing.  Headaches, body aches, or muscle aches.  Runny or stuffy (congested) nose.  A sore throat.  New loss of taste or smell. Some people may also have stomach problems, such as nausea, vomiting, or diarrhea. Other people may not have any symptoms of COVID-19. How is this diagnosed? This condition may be diagnosed based on:  Your signs and symptoms, especially if: ? You live in an area with a COVID-19 outbreak. ? You recently traveled to or from an area where the virus is common. ? You   provide care for or live with a person who was diagnosed with COVID-19. ? You were exposed to a person who was diagnosed with COVID-19.  A physical exam.  Lab tests, which may include: ? Taking a sample of fluid from the back of your nose and throat (nasopharyngeal fluid), your nose, or your throat using a swab. ? A sample of mucus from your lungs (sputum). ? Blood tests.  Imaging tests, which  may include, X-rays, CT scan, or ultrasound. How is this treated? At present, there is no medicine to treat COVID-19. Medicines that treat other diseases are being used on a trial basis to see if they are effective against COVID-19. Your health care provider will talk with you about ways to treat your symptoms. For most people, the infection is mild and can be managed at home with rest, fluids, and over-the-counter medicines. Treatment for a serious infection usually takes places in a hospital intensive care unit (ICU). It may include one or more of the following treatments. These treatments are given until your symptoms improve.  Receiving fluids and medicines through an IV.  Supplemental oxygen. Extra oxygen is given through a tube in the nose, a face mask, or a hood.  Positioning you to lie on your stomach (prone position). This makes it easier for oxygen to get into the lungs.  Continuous positive airway pressure (CPAP) or bi-level positive airway pressure (BPAP) machine. This treatment uses mild air pressure to keep the airways open. A tube that is connected to a motor delivers oxygen to the body.  Ventilator. This treatment moves air into and out of the lungs by using a tube that is placed in your windpipe.  Tracheostomy. This is a procedure to create a hole in the neck so that a breathing tube can be inserted.  Extracorporeal membrane oxygenation (ECMO). This procedure gives the lungs a chance to recover by taking over the functions of the heart and lungs. It supplies oxygen to the body and removes carbon dioxide. Follow these instructions at home: Lifestyle  If you are sick, stay home except to get medical care. Your health care provider will tell you how long to stay home. Call your health care provider before you go for medical care.  Rest at home as told by your health care provider.  Do not use any products that contain nicotine or tobacco, such as cigarettes, e-cigarettes, and  chewing tobacco. If you need help quitting, ask your health care provider.  Return to your normal activities as told by your health care provider. Ask your health care provider what activities are safe for you. General instructions  Take over-the-counter and prescription medicines only as told by your health care provider.  Drink enough fluid to keep your urine pale yellow.  Keep all follow-up visits as told by your health care provider. This is important. How is this prevented?  There is no vaccine to help prevent COVID-19 infection. However, there are steps you can take to protect yourself and others from this virus. To protect yourself:   Do not travel to areas where COVID-19 is a risk. The areas where COVID-19 is reported change often. To identify high-risk areas and travel restrictions, check the CDC travel website: wwwnc.cdc.gov/travel/notices  If you live in, or must travel to, an area where COVID-19 is a risk, take precautions to avoid infection. ? Stay away from people who are sick. ? Wash your hands often with soap and water for 20 seconds. If soap and water   are not available, use an alcohol-based hand sanitizer. ? Avoid touching your mouth, face, eyes, or nose. ? Avoid going out in public, follow guidance from your state and local health authorities. ? If you must go out in public, wear a cloth face covering or face mask. Make sure your mask covers your nose and mouth. ? Avoid crowded indoor spaces. Stay at least 6 feet (2 meters) away from others. ? Disinfect objects and surfaces that are frequently touched every day. This may include:  Counters and tables.  Doorknobs and light switches.  Sinks and faucets.  Electronics, such as phones, remote controls, keyboards, computers, and tablets. To protect others: If you have symptoms of COVID-19, take steps to prevent the virus from spreading to others.  If you think you have a COVID-19 infection, contact your health care  provider right away. Tell your health care team that you think you may have a COVID-19 infection.  Stay home. Leave your house only to seek medical care. Do not use public transport.  Do not travel while you are sick.  Wash your hands often with soap and water for 20 seconds. If soap and water are not available, use alcohol-based hand sanitizer.  Stay away from other members of your household. Let healthy household members care for children and pets, if possible. If you have to care for children or pets, wash your hands often and wear a mask. If possible, stay in your own room, separate from others. Use a different bathroom.  Make sure that all people in your household wash their hands well and often.  Cough or sneeze into a tissue or your sleeve or elbow. Do not cough or sneeze into your hand or into the air.  Wear a cloth face covering or face mask. Make sure your mask covers your nose and mouth. Where to find more information  Centers for Disease Control and Prevention: www.cdc.gov/coronavirus/2019-ncov/index.html  World Health Organization: www.who.int/health-topics/coronavirus Contact a health care provider if:  You live in or have traveled to an area where COVID-19 is a risk and you have symptoms of the infection.  You have had contact with someone who has COVID-19 and you have symptoms of the infection. Get help right away if:  You have trouble breathing.  You have pain or pressure in your chest.  You have confusion.  You have bluish lips and fingernails.  You have difficulty waking from sleep.  You have symptoms that get worse. These symptoms may represent a serious problem that is an emergency. Do not wait to see if the symptoms will go away. Get medical help right away. Call your local emergency services (911 in the [place] Do not drive yourself to the hospital. Let the emergency medical personnel know if you think you have COVID-19. Summary  COVID-19 is a  respiratory infection that is caused by a virus. It is also known as coronavirus disease or novel coronavirus. It can cause serious infections, such as pneumonia, acute respiratory distress syndrome, acute respiratory failure, or sepsis.  The virus that causes COVID-19 is contagious. This means that it can spread from person to person through droplets from breathing, speaking, singing, coughing, or sneezing.  You are more likely to develop a serious illness if you are 50 years of age or older, have a weak immune system, live in a nursing home, or have chronic disease.  There is no medicine to treat COVID-19. Your health care provider will talk with you about ways to treat your symptoms.    Take steps to protect yourself and others from infection. Wash your hands often and disinfect objects and surfaces that are frequently touched every day. Stay away from people who are sick and wear a mask if you are sick. This information is not intended to replace advice given to you by your health care provider. Make sure you discuss any questions you have with your health care provider. Document Revised: 02/24/2019 Document Reviewed: 06/02/2018 Elsevier Patient Education  2020 [employer]  

## 2020-09-30 ENCOUNTER — Emergency Department (HOSPITAL_COMMUNITY): Payer: Managed Care, Other (non HMO)

## 2020-09-30 ENCOUNTER — Emergency Department (HOSPITAL_COMMUNITY)
Admission: EM | Admit: 2020-09-30 | Discharge: 2020-09-30 | Disposition: A | Discharge status: Home / Self Care | Payer: Managed Care, Other (non HMO) | Attending: Emergency Medicine | Admitting: Emergency Medicine

## 2020-09-30 ENCOUNTER — Encounter (HOSPITAL_COMMUNITY): Payer: Self-pay | Admitting: Emergency Medicine

## 2020-09-30 DIAGNOSIS — K219 Gastro-esophageal reflux disease without esophagitis: Secondary | ICD-10-CM | POA: Diagnosis not present

## 2020-09-30 DIAGNOSIS — Z87891 Personal history of nicotine dependence: Secondary | ICD-10-CM | POA: Insufficient documentation

## 2020-09-30 DIAGNOSIS — R109 Unspecified abdominal pain: Secondary | ICD-10-CM | POA: Diagnosis present

## 2020-09-30 DIAGNOSIS — Z8616 Personal history of COVID-19: Secondary | ICD-10-CM | POA: Insufficient documentation

## 2020-09-30 DIAGNOSIS — N200 Calculus of kidney: Secondary | ICD-10-CM

## 2020-09-30 LAB — URINALYSIS, ROUTINE W REFLEX MICROSCOPIC
Bilirubin Urine: NEGATIVE
Glucose, UA: NEGATIVE mg/dL
Ketones, ur: 5 mg/dL — AB
Leukocytes,Ua: NEGATIVE
Nitrite: NEGATIVE
Protein, ur: 30 mg/dL — AB
RBC / HPF: >50 RBC/hpf — ABNORMAL HIGH (ref 0–5)
Specific Gravity, Urine: 1.023 (ref 1.005–1.030)
pH: 5 (ref 5.0–8.0)

## 2020-09-30 LAB — BASIC METABOLIC PANEL
Anion gap: 7 (ref 5–15)
BUN: 24 mg/dL — ABNORMAL HIGH (ref 6–20)
CO2: 27 mmol/L (ref 22–32)
Calcium: 9.4 mg/dL (ref 8.9–10.3)
Chloride: 105 mmol/L (ref 98–111)
Creatinine, Ser: 1.46 mg/dL — ABNORMAL HIGH (ref 0.61–1.24)
GFR, Estimated: 59 mL/min — ABNORMAL LOW (ref >60)
Glucose, Bld: 113 mg/dL — ABNORMAL HIGH (ref 70–99)
Potassium: 4.1 mmol/L (ref 3.5–5.1)
Sodium: 139 mmol/L (ref 135–145)

## 2020-09-30 LAB — CBC
HCT: 47.7 % (ref 39.0–52.0)
Hemoglobin: 16.2 g/dL (ref 13.0–17.0)
MCH: 30 pg (ref 26.0–34.0)
MCHC: 34 g/dL (ref 30.0–36.0)
MCV: 88.3 fL (ref 80.0–100.0)
Platelets: 291 10*3/uL (ref 150–400)
RBC: 5.4 MIL/uL (ref 4.22–5.81)
RDW: 12.2 % (ref 11.5–15.5)
WBC: 13.1 10*3/uL — ABNORMAL HIGH (ref 4.0–10.5)
nRBC: 0 % (ref 0.0–0.2)

## 2020-09-30 MED ORDER — OXYCODONE-ACETAMINOPHEN 5-325 MG PO TABS: 2.0000 | ORAL_TABLET | Freq: Three times a day (TID) | ORAL | 0 refills | Status: DC | PRN

## 2020-09-30 MED ORDER — OXYCODONE-ACETAMINOPHEN 5-325 MG PO TABS
1.0000 | ORAL_TABLET | Freq: Once | ORAL | Status: AC
Start: 2020-09-30 — End: 2020-09-30
  Administered 2020-09-30: 1 via ORAL
  Filled 2020-09-30: qty 1

## 2020-09-30 MED ORDER — OXYCODONE-ACETAMINOPHEN 5-325 MG PO TABS
1.0000 | ORAL_TABLET | Freq: Once | ORAL | Status: AC
  Administered 2020-09-30: 1 via ORAL
  Filled 2020-09-30: qty 1

## 2020-09-30 MED ORDER — KETOROLAC TROMETHAMINE 60 MG/2ML IM SOLN
60.0000 mg | Freq: Once | INTRAMUSCULAR | Status: AC
  Administered 2020-09-30: 60 mg via INTRAMUSCULAR
  Filled 2020-09-30: qty 2

## 2020-09-30 MED ORDER — ONDANSETRON HCL 4 MG PO TABS: 4.0000 mg | ORAL_TABLET | Freq: Four times a day (QID) | ORAL | 0 refills | Status: DC

## 2020-09-30 MED ORDER — TAMSULOSIN HCL 0.4 MG PO CAPS: 0.4000 mg | ORAL_CAPSULE | Freq: Every day | ORAL | 0 refills | Status: AC

## 2020-09-30 MED ORDER — DIPHENHYDRAMINE HCL 25 MG PO CAPS
25.0000 mg | ORAL_CAPSULE | Freq: Once | ORAL | Status: AC
  Administered 2020-09-30: 25 mg via ORAL
  Filled 2020-09-30: qty 1

## 2020-09-30 MED ORDER — ONDANSETRON 4 MG PO TBDP
4.0000 mg | ORAL_TABLET | Freq: Once | ORAL | Status: AC
  Administered 2020-09-30: 4 mg via ORAL
  Filled 2020-09-30: qty 1

## 2020-09-30 NOTE — ED Triage Notes (Signed)
Per pt, states he has been dealing with sciatica for the past few days-states he started having right flank pain this am-history of kidney stones-no dysuria

## 2020-09-30 NOTE — ED Notes (Signed)
Patient stated that he has not had very much water and unable to give a urine sample at this time

## 2020-09-30 NOTE — ED Provider Notes (Signed)
Emergency Medicine Provider Triage Evaluation Note  Brett Schaefer 50 y.o. male was evaluated in triage.  Pt complains of right-sided flank pain that began at 8 AM this morning.  He reports associated nausea/vomiting.  He states that he has been dealing with sciatica and thought initially was off and it started being in his right flank associate with nausea and vomiting feels like previous kidney stones he has had.  No dysuria, hematuria.  No fevers.   Review of Systems  Positive: Flank pain, nausea/vomiting Negative: Hematuria, dysuria, fevers.   Physical Exam  BP 134/82   Pulse 70   Temp 98.2 F (36.8 C) (Oral)   Resp 18   Ht 5\' 4"  (1.626 m)   Wt 65.8 kg   SpO2 100%   BMI 24.89 kg/m  Gen:   Awake, appears uncomfortable but no acute distress. HEENT:  Atraumatic  Resp:  Normal effort  Cardiac:  Normal rate  Abd:   Nondistended, right-sided CVA tenderness noted. MSK:   Moves extremities without difficulty  Neuro:  Speech clear   Other:    Medical Decision Making  Medically screening exam initiated at 12:13 PM  Appropriate orders placed.  Brett Schaefer was informed that the remainder of the evaluation will be completed by another provider, this initial triage assessment does not replace that evaluation. They are counseled that they will need to remain in the ED until the completion of their workup, including full H&P and results of any tests.  Risks of leaving the emergency department prior to completion of treatment were discussed. Patient was advised to inform ED staff if they are leaving before their treatment is complete. The patient acknowledged these risks and time was allowed for questions.     The patient appears stable so that the remainder of the MSE may be completed by another provider.   Clinical Impression  Flank pain   Portions of this note were generated with Dragon dictation software. Dictation errors may occur despite best attempts at  proofreading.      Volanda Napoleon, PA-C 09/30/20 1214    Lennice Sites, DO 09/30/20 1523

## 2020-09-30 NOTE — ED Provider Notes (Signed)
[facility] DEPT Provider Note   CSN: [id number] Arrival date & time: 09/30/20  1138     History Chief Complaint  Patient presents with  . Flank Pain    Brett Schaefer is a 50 y.o. male.  Patient here with flank pain  The history is provided by the patient.  Flank Pain This is a new problem. The problem occurs constantly. The problem has not changed since onset.Pertinent negatives include no chest pain, no abdominal pain and no shortness of breath. Nothing aggravates the symptoms. Nothing relieves the symptoms. He has tried nothing for the symptoms. The treatment provided no relief.  Patient with right-sided flank pain that started this morning.  History of sciatica.  Having more right-sided flank pain.  History of kidney stone.  Denies any hematuria.  Has had some nausea and vomiting.  Currently on narcotics for sciatica.     Past Medical History:  Diagnosis Date  . Allergy   . COVID-19 05/03/2020  . Fever   . GERD (gastroesophageal reflux disease)   . Heart murmur   . Hx of adenomatous colonic polyps 04/16/2016  . Kidney stone   . Migraines   . OSA on CPAP     Patient Active Problem List   Diagnosis Date Noted  . Acute recurrent maxillary sinusitis 08/05/2018  . Umbilical hernia without obstruction and without gangrene 02/12/2017  . OSA on CPAP 06/08/2016  . Morbid obesity due to excess calories ([place]) 06/08/2016  . Hx of adenomatous colonic polyps 04/16/2016  . HNP (herniated nucleus pulposus), lumbar 01/04/2013    Past Surgical History:  Procedure Laterality Date  . LUMBAR LAMINECTOMY/DECOMPRESSION MICRODISCECTOMY Left 01/04/2013   Procedure: MICRO LUMBAR DECOMPRESSION L4-S1 LEFT    (2 LEVELS)Discectomy at L4-L5;  Surgeon: Johnn Hai, MD;  Location: [facility];  Service: Orthopedics;  Laterality: Left;  . LUMBAR LAMINECTOMY/DECOMPRESSION MICRODISCECTOMY Left 12/21/2013   Procedure: REVISION L4 - L5 LEFT DISCECTOMY 1 LEVEL;   Surgeon: Melina Schools, MD;  Location: [facility];  Service: Orthopedics;  Laterality: Left;  . TONSILLECTOMY         Family History  Problem Relation Age of Onset  . Colon polyps Mother   . Heart disease Father        passed away of MI in his 55s.   . Early death Father 63    Social History   Tobacco Use  . Smoking status: Former Smoker    Packs/day: 1.00    Years: 5.00    Pack years: 5.00    Types: Cigarettes    Quit date: 05/11/1996    Years since quitting: 24.4  . Smokeless tobacco: Never Used  Vaping Use  . Vaping Use: Never used  Substance Use Topics  . Alcohol use: Yes    Comment: occasional  . Drug use: No    Home Medications Prior to Admission medications   Medication Sig Start Date End Date Taking? Authorizing Provider  ondansetron (ZOFRAN) 4 MG tablet Take 1 tablet (4 mg total) by mouth every 6 (six) hours. 09/30/20  Yes Deo Mehringer, DO  oxyCODONE-acetaminophen (PERCOCET/ROXICET) 5-325 MG tablet Take 2 tablets by mouth every 8 (eight) hours as needed for up to 20 doses for severe pain. 09/30/20  Yes Arvada Seaborn, DO  tamsulosin (FLOMAX) 0.4 MG CAPS capsule Take 1 capsule (0.4 mg total) by mouth daily for 7 days. 09/30/20 10/07/20 Yes Nallely Yost, DO  azithromycin (ZITHROMAX) 250 MG tablet 500 mg day 1, then 250 mg Qd  x4 days 05/15/20   Howard Pouch A, DO    Allergies    Aspirin, Ibuprofen, and Penicillins  Review of Systems   Review of Systems  Constitutional: Negative for chills and fever.  HENT: Negative for ear pain and sore throat.   Eyes: Negative for pain and visual disturbance.  Respiratory: Negative for cough and shortness of breath.   Cardiovascular: Negative for chest pain and palpitations.  Gastrointestinal: Negative for abdominal pain and vomiting.  Genitourinary: Positive for flank pain. Negative for decreased urine volume, difficulty urinating, dysuria, frequency, hematuria, testicular pain and urgency.  Musculoskeletal: Negative for  arthralgias and back pain.  Skin: Negative for color change and rash.  Neurological: Negative for seizures and syncope.  All other systems reviewed and are negative.   Physical Exam Updated Vital Signs  ED Triage Vitals  Enc Vitals Group     BP 09/30/20 1149 (!) 152/104     Pulse Rate 09/30/20 1149 60     Resp 09/30/20 1149 (!) 21     Temp 09/30/20 1149 97.9 F (36.6 C)     Temp Source 09/30/20 1149 Oral     SpO2 09/30/20 1149 99 %     Weight 09/30/20 1153 250 lb (113.4 kg)     Height 09/30/20 1153 6\' 1"  (1.854 m)     Head Circumference --      Peak Flow --      Pain Score 09/30/20 1208 9     Pain Loc --      Pain Edu? --      Excl. in [place]? --     Physical Exam Vitals and nursing note reviewed.  Constitutional:      General: He is not in acute distress.    Appearance: He is well-developed. He is not ill-appearing.  HENT:     Head: Normocephalic and atraumatic.     Nose: Nose normal.     Mouth/Throat:     Mouth: Mucous membranes are moist.  Eyes:     Extraocular Movements: Extraocular movements intact.     Conjunctiva/sclera: Conjunctivae normal.     Pupils: Pupils are equal, round, and reactive to light.  Cardiovascular:     Rate and Rhythm: Normal rate and regular rhythm.     Pulses: Normal pulses.     Heart sounds: Normal heart sounds. No murmur heard.   Pulmonary:     Effort: Pulmonary effort is normal. No respiratory distress.     Breath sounds: Normal breath sounds.  Abdominal:     Palpations: Abdomen is soft.     Tenderness: There is no abdominal tenderness. There is right CVA tenderness.  Musculoskeletal:     Cervical back: Normal range of motion and neck supple.  Skin:    General: Skin is warm and dry.     Capillary Refill: Capillary refill takes less than 2 seconds.  Neurological:     General: No focal deficit present.     Mental Status: He is alert.     ED Results / Procedures / Treatments   Labs (all labs ordered are listed, but only  abnormal results are displayed) Labs Reviewed  URINALYSIS, ROUTINE W REFLEX MICROSCOPIC - Abnormal; Notable for the following components:      Result Value   APPearance CLOUDY (*)    Hgb urine dipstick LARGE (*)    Ketones, ur 5 (*)    Protein, ur 30 (*)    RBC / HPF >50 (*)    Bacteria, UA  RARE (*)    All other components within normal limits  BASIC METABOLIC PANEL - Abnormal; Notable for the following components:   Glucose, Bld 113 (*)    BUN 24 (*)    Creatinine, Ser 1.46 (*)    GFR, Estimated 59 (*)    All other components within normal limits  CBC - Abnormal; Notable for the following components:   WBC 13.1 (*)    All other components within normal limits    EKG None  Radiology CT Renal Stone Study  Result Date: 09/30/2020 CLINICAL DATA:  Right-sided flank pain EXAM: CT ABDOMEN AND PELVIS WITHOUT CONTRAST TECHNIQUE: Multidetector CT imaging of the abdomen and pelvis was performed following the standard protocol without IV contrast. COMPARISON:  02/23/2018 FINDINGS: Lower chest: No acute abnormality. Hepatobiliary: No focal liver abnormality is seen. No gallstones, gallbladder wall thickening, or biliary dilatation. Pancreas: Unremarkable. No pancreatic ductal dilatation or surrounding inflammatory changes. Spleen: Normal in size without focal abnormality. Adrenals/Urinary Tract: Adrenal glands are within normal limits. Left kidney demonstrates a few tiny nonobstructing stones. Right kidney also demonstrates a few tiny nonobstructing stones although mild hydronephrosis and hydroureter is noted which extends to the level of the ureterovesical junction. At the right UVJ there is a 3-4 mm obstructing stone identified. Bladder is partially distended. Stomach/Bowel: Colon is decompressed. No obstructive or inflammatory changes are seen. The appendix is within normal limits. Small bowel and stomach are unremarkable. Vascular/Lymphatic: Aortic atherosclerosis. No enlarged abdominal or pelvic  lymph nodes. Reproductive: Prostate is unremarkable. Other: No abdominal wall hernia or abnormality. No abdominopelvic ascites. Musculoskeletal: Mild degenerative changes of lumbar spine. IMPRESSION: 3-4 mm right UVJ stone with obstructive change. Tiny bilateral nonobstructing renal calculi. Electronically Signed   By: Inez Catalina M.D.   On: 09/30/2020 13:24    Procedures Procedures   Medications Ordered in ED Medications  ondansetron (ZOFRAN-ODT) disintegrating tablet 4 mg (4 mg Oral Given 09/30/20 1225)  oxyCODONE-acetaminophen (PERCOCET/ROXICET) 5-325 MG per tablet 1 tablet (1 tablet Oral Given 09/30/20 1225)  ketorolac (TORADOL) injection 60 mg (60 mg Intramuscular Given 09/30/20 1551)  diphenhydrAMINE (BENADRYL) capsule 25 mg (25 mg Oral Given 09/30/20 1551)  oxyCODONE-acetaminophen (PERCOCET/ROXICET) 5-325 MG per tablet 1 tablet (1 tablet Oral Given 09/30/20 1551)    ED Course  I have reviewed the triage vital signs and the nursing notes.  Pertinent labs & imaging results that were available during my care of the patient were reviewed by me and considered in my medical decision making (see chart for details).    MDM Rules/Calculators/A&P                          Brett Schaefer is here with right-sided flank pain that started this morning.  History of kidney stones.  Tender in this area.  Denies any hematuria.  Has had some nausea and vomiting.  Has had bad sciatica recently and is on narcotic pain medicine for that.  This seems unrelated.  CT scan confirmed 3 mm right UVJ stone.  However no significant leukocytosis, anemia, electrolyte abnormality.  Urinalysis negative for infection.  Felt better after pain medications in the ED.  Will prescribe additional narcotic pain medicine, Flomax, Zofran.  Discharged in good condition.  Understands return precautions.  We will have him follow-up with urology.  This chart was dictated using voice recognition software.  Despite best efforts to  proofread,  errors can occur which can change the documentation meaning.  Final Clinical Impression(s) / ED Diagnoses Final diagnoses:  Kidney stone    Rx / DC Orders ED Discharge Orders         Ordered    oxyCODONE-acetaminophen (PERCOCET/ROXICET) 5-325 MG tablet  Every 8 hours PRN        09/30/20 1617    ondansetron (ZOFRAN) 4 MG tablet  Every 6 hours        09/30/20 1617    tamsulosin (FLOMAX) 0.4 MG CAPS capsule  Daily        09/30/20 1617           Lennice Sites, DO 09/30/20 1619

## 2020-10-01 ENCOUNTER — Encounter: Payer: Self-pay | Admitting: Family Medicine

## 2020-10-01 DIAGNOSIS — N200 Calculus of kidney: Secondary | ICD-10-CM | POA: Insufficient documentation

## 2020-10-08 ENCOUNTER — Other Ambulatory Visit: Payer: Self-pay | Admitting: Neurological Surgery

## 2020-10-08 DIAGNOSIS — M5416 Radiculopathy, lumbar region: Secondary | ICD-10-CM

## 2020-10-09 ENCOUNTER — Other Ambulatory Visit: Payer: Self-pay

## 2020-10-09 ENCOUNTER — Ambulatory Visit
Admission: RE | Admit: 2020-10-09 | Discharge: 2020-10-09 | Disposition: A | Discharge status: Home / Self Care | Payer: Managed Care, Other (non HMO) | Source: Ambulatory Visit | Attending: Neurological Surgery | Admitting: Neurological Surgery

## 2020-10-09 DIAGNOSIS — M5416 Radiculopathy, lumbar region: Secondary | ICD-10-CM

## 2020-10-09 MED ORDER — GADOBENATE DIMEGLUMINE 529 MG/ML IV SOLN
20.0000 mL | Freq: Once | INTRAVENOUS | Status: AC | PRN
  Administered 2020-10-09: 20 mL via INTRAVENOUS

## 2020-10-14 ENCOUNTER — Other Ambulatory Visit: Payer: Self-pay | Admitting: Neurological Surgery

## 2020-10-14 DIAGNOSIS — M5126 Other intervertebral disc displacement, lumbar region: Secondary | ICD-10-CM

## 2020-10-14 DIAGNOSIS — M48061 Spinal stenosis, lumbar region without neurogenic claudication: Secondary | ICD-10-CM

## 2020-10-28 ENCOUNTER — Encounter (HOSPITAL_COMMUNITY): Payer: Self-pay

## 2020-10-28 ENCOUNTER — Other Ambulatory Visit: Payer: Self-pay

## 2020-10-28 ENCOUNTER — Encounter (HOSPITAL_COMMUNITY)
Admission: RE | Admit: 2020-10-28 | Discharge: 2020-10-28 | Disposition: A | Discharge status: Home / Self Care | Payer: Managed Care, Other (non HMO) | Source: Ambulatory Visit | Attending: Neurological Surgery | Admitting: Neurological Surgery

## 2020-10-28 ENCOUNTER — Ambulatory Visit (HOSPITAL_COMMUNITY)
Admission: RE | Admit: 2020-10-28 | Discharge: 2020-10-28 | Disposition: A | Discharge status: Home / Self Care | Payer: Managed Care, Other (non HMO) | Source: Ambulatory Visit | Attending: Neurological Surgery | Admitting: Neurological Surgery

## 2020-10-28 DIAGNOSIS — M5126 Other intervertebral disc displacement, lumbar region: Secondary | ICD-10-CM | POA: Insufficient documentation

## 2020-10-28 DIAGNOSIS — Z20822 Contact with and (suspected) exposure to covid-19: Secondary | ICD-10-CM | POA: Insufficient documentation

## 2020-10-28 DIAGNOSIS — M48061 Spinal stenosis, lumbar region without neurogenic claudication: Secondary | ICD-10-CM

## 2020-10-28 DIAGNOSIS — Z01818 Encounter for other preprocedural examination: Secondary | ICD-10-CM | POA: Insufficient documentation

## 2020-10-28 HISTORY — DX: Personal history of urinary calculi: Z87.442

## 2020-10-28 LAB — CBC WITH DIFFERENTIAL/PLATELET
Abs Immature Granulocytes: 0.02 10*3/uL (ref 0.00–0.07)
Basophils Absolute: 0.1 10*3/uL (ref 0.0–0.1)
Basophils Relative: 1 %
Eosinophils Absolute: 0.6 10*3/uL — ABNORMAL HIGH (ref 0.0–0.5)
Eosinophils Relative: 8 %
HCT: 47.4 % (ref 39.0–52.0)
Hemoglobin: 16.7 g/dL (ref 13.0–17.0)
Immature Granulocytes: 0 %
Lymphocytes Relative: 39 %
Lymphs Abs: 3.1 10*3/uL (ref 0.7–4.0)
MCH: 30.5 pg (ref 26.0–34.0)
MCHC: 35.2 g/dL (ref 30.0–36.0)
MCV: 86.5 fL (ref 80.0–100.0)
Monocytes Absolute: 0.8 10*3/uL (ref 0.1–1.0)
Monocytes Relative: 10 %
Neutro Abs: 3.4 10*3/uL (ref 1.7–7.7)
Neutrophils Relative %: 42 %
Platelets: 300 10*3/uL (ref 150–400)
RBC: 5.48 MIL/uL (ref 4.22–5.81)
RDW: 12 % (ref 11.5–15.5)
WBC: 8 10*3/uL (ref 4.0–10.5)
nRBC: 0 % (ref 0.0–0.2)

## 2020-10-28 LAB — BASIC METABOLIC PANEL
Anion gap: 8 (ref 5–15)
BUN: 16 mg/dL (ref 6–20)
CO2: 27 mmol/L (ref 22–32)
Calcium: 9.4 mg/dL (ref 8.9–10.3)
Chloride: 102 mmol/L (ref 98–111)
Creatinine, Ser: 1.2 mg/dL (ref 0.61–1.24)
GFR, Estimated: >60 mL/min (ref >60)
Glucose, Bld: 98 mg/dL (ref 70–99)
Potassium: 4.2 mmol/L (ref 3.5–5.1)
Sodium: 137 mmol/L (ref 135–145)

## 2020-10-28 LAB — TYPE AND SCREEN
ABO/RH(D): O POS
Antibody Screen: NEGATIVE

## 2020-10-28 LAB — SURGICAL PCR SCREEN
MRSA, PCR: NEGATIVE
Staphylococcus aureus: NEGATIVE

## 2020-10-28 LAB — PROTIME-INR
INR: 1 (ref 0.8–1.2)
Prothrombin Time: 13.2 seconds (ref 11.4–15.2)

## 2020-10-28 LAB — SARS CORONAVIRUS 2 (TAT 6-24 HRS): SARS Coronavirus 2: NEGATIVE

## 2020-10-28 NOTE — Progress Notes (Signed)
PCP - Howard Pouch, DO Cardiologist - denies  Chest x-ray - 10/28/20 EKG - 10/28/20 Stress Test - denies ECHO - 10+ years ago, stated it was normal. Cardiac Cath - denies  Sleep Study - OSA+ CPAP - uses nightly   Blood Thinner Instructions: n/a Aspirin Instructions: n/a  COVID TEST- 10/28/20 done in PAT  Anesthesia review: No  Patient denies shortness of breath, fever, cough and chest pain at PAT appointment   All instructions explained to the patient, with a verbal understanding of the material. Patient agrees to go over the instructions while at home for a better understanding. Patient also instructed to self quarantine after being tested for COVID-19. The opportunity to ask questions was provided.

## 2020-10-28 NOTE — Pre-Procedure Instructions (Signed)
Surgical Instructions    Your procedure is scheduled on Wednesday, June 22nd.  Report to [facility] Main Entrance "A" at 9:00 A.M., then check in with the Admitting office.  Call this number if you have problems the morning of surgery:  [phone]   If you have any questions prior to your surgery date call [phone]: Open Monday-Friday 8am-4pm    Remember:  Do not eat or drink after midnight the night before your surgery    Take these medicines the morning of surgery with A SIP OF WATER  gabapentin (NEURONTIN)  HYDROmorphone (DILAUDID)   As of today, STOP taking any Aspirin (unless otherwise instructed by your surgeon) Aleve, Naproxen, Ibuprofen, Motrin, Advil, Goody's, BC's, all herbal medications, fish oil, and all vitamins.                     Do NOT Smoke (Tobacco/Vaping) or drink Alcohol 24 hours prior to your procedure.  If you use a CPAP at night, you may bring all equipment for your overnight stay.   Contacts, glasses, piercing's, hearing aid's, dentures or partials may not be worn into surgery, please bring cases for these belongings.    For patients admitted to the hospital, discharge time will be determined by your treatment team.   Patients discharged the day of surgery will not be allowed to drive home, and someone needs to stay with them for 24 hours.    Special instructions:   [facility]- Preparing For Surgery  Before surgery, you can play an important role. Because skin is not sterile, your skin needs to be as free of germs as possible. You can reduce the number of germs on your skin by washing with CHG (chlorahexidine gluconate) Soap before surgery.  CHG is an antiseptic cleaner which kills germs and bonds with the skin to continue killing germs even after washing.    Oral Hygiene is also important to reduce your risk of infection.  Remember - BRUSH YOUR TEETH THE MORNING OF SURGERY WITH YOUR REGULAR TOOTHPASTE  Please do not use if you have an allergy  to CHG or antibacterial soaps. If your skin becomes reddened/irritated stop using the CHG.  Do not shave (including legs and underarms) for at least 48 hours prior to first CHG shower. It is OK to shave your face.  Please follow these instructions carefully.   Shower the NIGHT BEFORE SURGERY and the MORNING OF SURGERY  If you chose to wash your hair, wash your hair first as usual with your normal shampoo.  After you shampoo, rinse your hair and body thoroughly to remove the shampoo.  Use CHG Soap as you would any other liquid soap. You can apply CHG directly to the skin and wash gently with a scrungie or a clean washcloth.   Apply the CHG Soap to your body ONLY FROM THE NECK DOWN.  Do not use on open wounds or open sores. Avoid contact with your eyes, ears, mouth and genitals (private parts). Wash Face and genitals (private parts)  with your normal soap.   Wash thoroughly, paying special attention to the area where your surgery will be performed.  Thoroughly rinse your body with warm water from the neck down.  DO NOT shower/wash with your normal soap after using and rinsing off the CHG Soap.  Pat yourself dry with a CLEAN TOWEL.  Wear CLEAN PAJAMAS to bed the night before surgery  Place CLEAN SHEETS on your bed the night before your surgery  DO NOT SLEEP WITH PETS.   Day of Surgery: Shower with CHG soap. Do not wear jewelry. Do not wear lotions, powders, colognes, or deodorant. Men may shave face and neck. Do not bring valuables to the hospital. [facility] is not responsible for any belongings or valuables. Wear Clean/Comfortable clothing the morning of surgery Remember to brush your teeth WITH YOUR REGULAR TOOTHPASTE.   Please read over the following fact sheets that you were given.

## 2020-10-30 ENCOUNTER — Encounter (HOSPITAL_COMMUNITY)
Admission: RE | Disposition: A | Discharge status: Home / Self Care | Payer: Self-pay | Source: Home / Self Care | Attending: Neurological Surgery

## 2020-10-30 ENCOUNTER — Inpatient Hospital Stay (HOSPITAL_COMMUNITY)
Admission: RE | Admit: 2020-10-30 | Discharge: 2020-10-31 | DRG: 455 | Disposition: A | Discharge status: Home / Self Care | Payer: Managed Care, Other (non HMO) | Attending: Neurological Surgery | Admitting: Neurological Surgery

## 2020-10-30 ENCOUNTER — Encounter (HOSPITAL_COMMUNITY): Payer: Self-pay | Admitting: Neurological Surgery

## 2020-10-30 ENCOUNTER — Other Ambulatory Visit: Payer: Self-pay

## 2020-10-30 ENCOUNTER — Inpatient Hospital Stay (HOSPITAL_COMMUNITY): Payer: Managed Care, Other (non HMO) | Admitting: Certified Registered Nurse Anesthetist

## 2020-10-30 ENCOUNTER — Inpatient Hospital Stay (HOSPITAL_COMMUNITY): Payer: Managed Care, Other (non HMO)

## 2020-10-30 DIAGNOSIS — M5116 Intervertebral disc disorders with radiculopathy, lumbar region: Principal | ICD-10-CM | POA: Diagnosis present

## 2020-10-30 DIAGNOSIS — Z880 Allergy status to penicillin: Secondary | ICD-10-CM | POA: Diagnosis not present

## 2020-10-30 DIAGNOSIS — Z8616 Personal history of COVID-19: Secondary | ICD-10-CM | POA: Diagnosis not present

## 2020-10-30 DIAGNOSIS — Z8249 Family history of ischemic heart disease and other diseases of the circulatory system: Secondary | ICD-10-CM | POA: Diagnosis not present

## 2020-10-30 DIAGNOSIS — G4733 Obstructive sleep apnea (adult) (pediatric): Secondary | ICD-10-CM | POA: Diagnosis present

## 2020-10-30 DIAGNOSIS — M48061 Spinal stenosis, lumbar region without neurogenic claudication: Secondary | ICD-10-CM | POA: Diagnosis present

## 2020-10-30 DIAGNOSIS — Z886 Allergy status to analgesic agent status: Secondary | ICD-10-CM | POA: Diagnosis not present

## 2020-10-30 DIAGNOSIS — Z981 Arthrodesis status: Secondary | ICD-10-CM

## 2020-10-30 DIAGNOSIS — Z419 Encounter for procedure for purposes other than remedying health state, unspecified: Secondary | ICD-10-CM

## 2020-10-30 HISTORY — PX: LUMBAR LAMINECTOMY/DECOMPRESSION MICRODISCECTOMY: SHX5026

## 2020-10-30 LAB — ABO/RH: ABO/RH(D): O POS

## 2020-10-30 SURGERY — POSTERIOR LUMBAR FUSION 1 LEVEL
Anesthesia: General | Site: Back

## 2020-10-30 MED ORDER — ACETAMINOPHEN 325 MG PO TABS
650.0000 mg | ORAL_TABLET | ORAL | Status: DC | PRN
  Administered 2020-10-30 – 2020-10-31 (×3): 650 mg via ORAL
  Filled 2020-10-30 (×3): qty 2

## 2020-10-30 MED ORDER — PROPOFOL 10 MG/ML IV BOLUS
INTRAVENOUS | Status: DC | PRN
  Administered 2020-10-30: 200 mg via INTRAVENOUS

## 2020-10-30 MED ORDER — FENTANYL CITRATE (PF) 100 MCG/2ML IJ SOLN
INTRAMUSCULAR | Status: AC
  Filled 2020-10-30: qty 2

## 2020-10-30 MED ORDER — METHOCARBAMOL 500 MG PO TABS
500.0000 mg | ORAL_TABLET | Freq: Four times a day (QID) | ORAL | Status: DC | PRN
  Administered 2020-10-30 – 2020-10-31 (×4): 500 mg via ORAL
  Filled 2020-10-30 (×3): qty 1

## 2020-10-30 MED ORDER — VANCOMYCIN HCL 1500 MG/300ML IV SOLN
1500.0000 mg | INTRAVENOUS | Status: AC
  Administered 2020-10-30: 1500 mg via INTRAVENOUS
  Filled 2020-10-30 (×2): qty 300

## 2020-10-30 MED ORDER — ORAL CARE MOUTH RINSE: 15.0000 mL | Freq: Once | OROMUCOSAL | Status: AC

## 2020-10-30 MED ORDER — LIDOCAINE HCL (PF) 2 % IJ SOLN
INTRAMUSCULAR | Status: AC
  Filled 2020-10-30: qty 5

## 2020-10-30 MED ORDER — LACTATED RINGERS IV SOLN: INTRAVENOUS | Status: DC

## 2020-10-30 MED ORDER — METHOCARBAMOL 500 MG PO TABS
ORAL_TABLET | ORAL | Status: AC
  Filled 2020-10-30: qty 1

## 2020-10-30 MED ORDER — SODIUM CHLORIDE 0.9 % IV SOLN
250.0000 mL | INTRAVENOUS | Status: DC
  Administered 2020-10-30: 250 mL via INTRAVENOUS

## 2020-10-30 MED ORDER — OXYCODONE HCL 5 MG/5ML PO SOLN: 5.0000 mg | Freq: Once | ORAL | Status: AC | PRN

## 2020-10-30 MED ORDER — SENNA 8.6 MG PO TABS
1.0000 | ORAL_TABLET | Freq: Two times a day (BID) | ORAL | Status: DC
  Administered 2020-10-30 – 2020-10-31 (×2): 8.6 mg via ORAL
  Filled 2020-10-30 (×2): qty 1

## 2020-10-30 MED ORDER — BUPIVACAINE HCL (PF) 0.25 % IJ SOLN
INTRAMUSCULAR | Status: DC | PRN
  Administered 2020-10-30: 10 mL

## 2020-10-30 MED ORDER — SODIUM CHLORIDE 0.9% FLUSH: 3.0000 mL | INTRAVENOUS | Status: DC | PRN

## 2020-10-30 MED ORDER — SUGAMMADEX SODIUM 200 MG/2ML IV SOLN
INTRAVENOUS | Status: DC | PRN
  Administered 2020-10-30: 225 mg via INTRAVENOUS

## 2020-10-30 MED ORDER — KETAMINE HCL 50 MG/5ML IJ SOSY
PREFILLED_SYRINGE | INTRAMUSCULAR | Status: AC
  Filled 2020-10-30: qty 5

## 2020-10-30 MED ORDER — GABAPENTIN 300 MG PO CAPS: 300.0000 mg | ORAL_CAPSULE | ORAL | Status: AC

## 2020-10-30 MED ORDER — MIDAZOLAM HCL 2 MG/2ML IJ SOLN
INTRAMUSCULAR | Status: DC | PRN
  Administered 2020-10-30: 2 mg via INTRAVENOUS

## 2020-10-30 MED ORDER — PHENOL 1.4 % MT LIQD: 1.0000 | OROMUCOSAL | Status: DC | PRN

## 2020-10-30 MED ORDER — DEXAMETHASONE 4 MG PO TABS
4.0000 mg | ORAL_TABLET | Freq: Four times a day (QID) | ORAL | Status: DC
  Administered 2020-10-30 – 2020-10-31 (×2): 4 mg via ORAL
  Filled 2020-10-30 (×2): qty 1

## 2020-10-30 MED ORDER — ACETAMINOPHEN 500 MG PO TABS
ORAL_TABLET | ORAL | Status: AC
  Administered 2020-10-30: 1000 mg via ORAL
  Filled 2020-10-30: qty 2

## 2020-10-30 MED ORDER — PHENYLEPHRINE 40 MCG/ML (10ML) SYRINGE FOR IV PUSH (FOR BLOOD PRESSURE SUPPORT)
PREFILLED_SYRINGE | INTRAVENOUS | Status: AC
  Filled 2020-10-30: qty 20

## 2020-10-30 MED ORDER — DEXAMETHASONE SODIUM PHOSPHATE 10 MG/ML IJ SOLN
INTRAMUSCULAR | Status: DC | PRN
  Administered 2020-10-30: 10 mg via INTRAVENOUS

## 2020-10-30 MED ORDER — LIDOCAINE HCL (CARDIAC) PF 100 MG/5ML IV SOSY
PREFILLED_SYRINGE | INTRAVENOUS | Status: DC | PRN
  Administered 2020-10-30: 60 mg via INTRAVENOUS

## 2020-10-30 MED ORDER — CHLORHEXIDINE GLUCONATE CLOTH 2 % EX PADS: 6.0000 | MEDICATED_PAD | Freq: Once | CUTANEOUS | Status: DC

## 2020-10-30 MED ORDER — THROMBIN 20000 UNITS EX SOLR
CUTANEOUS | Status: AC
  Filled 2020-10-30: qty 20000

## 2020-10-30 MED ORDER — OXYCODONE HCL 5 MG PO TABS
10.0000 mg | ORAL_TABLET | ORAL | Status: DC | PRN
Start: 2020-10-30 — End: 2020-10-31
  Administered 2020-10-30 – 2020-10-31 (×6): 10 mg via ORAL
  Filled 2020-10-30 (×6): qty 2

## 2020-10-30 MED ORDER — DEXAMETHASONE SODIUM PHOSPHATE 4 MG/ML IJ SOLN
4.0000 mg | Freq: Four times a day (QID) | INTRAMUSCULAR | Status: DC
  Administered 2020-10-30: 4 mg via INTRAVENOUS
  Filled 2020-10-30: qty 1

## 2020-10-30 MED ORDER — MIDAZOLAM HCL 2 MG/2ML IJ SOLN
INTRAMUSCULAR | Status: AC
  Filled 2020-10-30: qty 2

## 2020-10-30 MED ORDER — ONDANSETRON HCL 4 MG/2ML IJ SOLN
INTRAMUSCULAR | Status: AC
  Filled 2020-10-30: qty 2

## 2020-10-30 MED ORDER — FENTANYL CITRATE (PF) 250 MCG/5ML IJ SOLN
INTRAMUSCULAR | Status: DC | PRN
  Administered 2020-10-30 (×2): 50 ug via INTRAVENOUS
  Administered 2020-10-30: 100 ug via INTRAVENOUS

## 2020-10-30 MED ORDER — GABAPENTIN 300 MG PO CAPS
ORAL_CAPSULE | ORAL | Status: AC
  Administered 2020-10-30: 300 mg via ORAL
  Filled 2020-10-30: qty 1

## 2020-10-30 MED ORDER — HYDROMORPHONE HCL 1 MG/ML IJ SOLN: 0.5000 mg | INTRAMUSCULAR | Status: DC | PRN

## 2020-10-30 MED ORDER — PROPOFOL 10 MG/ML IV BOLUS
INTRAVENOUS | Status: AC
  Filled 2020-10-30: qty 20

## 2020-10-30 MED ORDER — KETAMINE HCL 10 MG/ML IJ SOLN
INTRAMUSCULAR | Status: DC | PRN
  Administered 2020-10-30: 25 mg via INTRAVENOUS
  Administered 2020-10-30: 15 mg via INTRAVENOUS

## 2020-10-30 MED ORDER — VANCOMYCIN HCL 1250 MG/250ML IV SOLN
1250.0000 mg | Freq: Once | INTRAVENOUS | Status: AC
  Administered 2020-10-30: 1250 mg via INTRAVENOUS
  Filled 2020-10-30: qty 250

## 2020-10-30 MED ORDER — GABAPENTIN 300 MG PO CAPS
300.0000 mg | ORAL_CAPSULE | Freq: Three times a day (TID) | ORAL | Status: DC
  Administered 2020-10-30 – 2020-10-31 (×3): 300 mg via ORAL
  Filled 2020-10-30 (×3): qty 1

## 2020-10-30 MED ORDER — SODIUM CHLORIDE 0.9% FLUSH
3.0000 mL | Freq: Two times a day (BID) | INTRAVENOUS | Status: DC
  Administered 2020-10-30: 3 mL via INTRAVENOUS

## 2020-10-30 MED ORDER — DEXAMETHASONE SODIUM PHOSPHATE 10 MG/ML IJ SOLN
INTRAMUSCULAR | Status: AC
  Filled 2020-10-30: qty 1

## 2020-10-30 MED ORDER — ACETAMINOPHEN 500 MG PO TABS: 1000.0000 mg | ORAL_TABLET | ORAL | Status: AC

## 2020-10-30 MED ORDER — ONDANSETRON HCL 4 MG/2ML IJ SOLN
INTRAMUSCULAR | Status: DC | PRN
  Administered 2020-10-30: 4 mg via INTRAVENOUS

## 2020-10-30 MED ORDER — OXYCODONE HCL 5 MG PO TABS
ORAL_TABLET | ORAL | Status: AC
  Filled 2020-10-30: qty 1

## 2020-10-30 MED ORDER — POTASSIUM CHLORIDE IN NACL 20-0.9 MEQ/L-% IV SOLN: INTRAVENOUS | Status: DC

## 2020-10-30 MED ORDER — THROMBIN 5000 UNITS EX SOLR: OROMUCOSAL | Status: DC | PRN

## 2020-10-30 MED ORDER — ACETAMINOPHEN 650 MG RE SUPP: 650.0000 mg | RECTAL | Status: DC | PRN

## 2020-10-30 MED ORDER — CHLORHEXIDINE GLUCONATE 0.12 % MT SOLN
OROMUCOSAL | Status: AC
  Administered 2020-10-30: 15 mL via OROMUCOSAL
  Filled 2020-10-30: qty 15

## 2020-10-30 MED ORDER — ROCURONIUM BROMIDE 10 MG/ML (PF) SYRINGE
PREFILLED_SYRINGE | INTRAVENOUS | Status: AC
  Filled 2020-10-30: qty 10

## 2020-10-30 MED ORDER — PHENYLEPHRINE 40 MCG/ML (10ML) SYRINGE FOR IV PUSH (FOR BLOOD PRESSURE SUPPORT)
PREFILLED_SYRINGE | INTRAVENOUS | Status: DC | PRN
  Administered 2020-10-30: 40 ug via INTRAVENOUS
  Administered 2020-10-30: 80 ug via INTRAVENOUS
  Administered 2020-10-30: 120 ug via INTRAVENOUS

## 2020-10-30 MED ORDER — DEXAMETHASONE SODIUM PHOSPHATE 10 MG/ML IJ SOLN: 10.0000 mg | Freq: Once | INTRAMUSCULAR | Status: DC

## 2020-10-30 MED ORDER — FENTANYL CITRATE (PF) 250 MCG/5ML IJ SOLN
INTRAMUSCULAR | Status: AC
  Filled 2020-10-30: qty 5

## 2020-10-30 MED ORDER — ONDANSETRON HCL 4 MG/2ML IJ SOLN: 4.0000 mg | Freq: Four times a day (QID) | INTRAMUSCULAR | Status: DC | PRN

## 2020-10-30 MED ORDER — METHOCARBAMOL 1000 MG/10ML IJ SOLN
500.0000 mg | Freq: Four times a day (QID) | INTRAVENOUS | Status: DC | PRN
  Filled 2020-10-30: qty 5

## 2020-10-30 MED ORDER — BUPIVACAINE HCL (PF) 0.25 % IJ SOLN
INTRAMUSCULAR | Status: AC
  Filled 2020-10-30: qty 30

## 2020-10-30 MED ORDER — THROMBIN 5000 UNITS EX SOLR
CUTANEOUS | Status: AC
  Filled 2020-10-30: qty 5000

## 2020-10-30 MED ORDER — 0.9 % SODIUM CHLORIDE (POUR BTL) OPTIME
TOPICAL | Status: DC | PRN
  Administered 2020-10-30: 1000 mL

## 2020-10-30 MED ORDER — FENTANYL CITRATE (PF) 100 MCG/2ML IJ SOLN
25.0000 ug | INTRAMUSCULAR | Status: DC | PRN
  Administered 2020-10-30 (×2): 50 ug via INTRAVENOUS

## 2020-10-30 MED ORDER — ROCURONIUM BROMIDE 10 MG/ML (PF) SYRINGE
PREFILLED_SYRINGE | INTRAVENOUS | Status: DC | PRN
  Administered 2020-10-30 (×2): 10 mg via INTRAVENOUS
  Administered 2020-10-30: 80 mg via INTRAVENOUS
  Administered 2020-10-30 (×2): 10 mg via INTRAVENOUS

## 2020-10-30 MED ORDER — OXYCODONE HCL 5 MG PO TABS
5.0000 mg | ORAL_TABLET | Freq: Once | ORAL | Status: AC | PRN
  Administered 2020-10-30: 5 mg via ORAL

## 2020-10-30 MED ORDER — THROMBIN 20000 UNITS EX SOLR: CUTANEOUS | Status: DC | PRN

## 2020-10-30 MED ORDER — CHLORHEXIDINE GLUCONATE 0.12 % MT SOLN: 15.0000 mL | Freq: Once | OROMUCOSAL | Status: AC

## 2020-10-30 MED ORDER — ONDANSETRON HCL 4 MG PO TABS: 4.0000 mg | ORAL_TABLET | Freq: Four times a day (QID) | ORAL | Status: DC | PRN

## 2020-10-30 MED ORDER — MENTHOL 3 MG MT LOZG: 1.0000 | LOZENGE | OROMUCOSAL | Status: DC | PRN

## 2020-10-30 SURGICAL SUPPLY — 69 items
ADH SKN CLS APL DERMABOND .7 (GAUZE/BANDAGES/DRESSINGS) ×1
APL SKNCLS STERI-STRIP NONHPOA (GAUZE/BANDAGES/DRESSINGS) ×1
BAND INSRT 18 STRL LF DISP RB (MISCELLANEOUS)
BAND RUBBER #18 3X1/16 STRL (MISCELLANEOUS) ×2 IMPLANT
BASKET BONE COLLECTION (BASKET) ×3 IMPLANT
BENZOIN TINCTURE PRP APPL 2/3 (GAUZE/BANDAGES/DRESSINGS) ×4 IMPLANT
BLADE CLIPPER SURG (BLADE) IMPLANT
BUR CARBIDE MATCH 3.0 (BURR) ×4 IMPLANT
CANISTER SUCT 3000ML PPV (MISCELLANEOUS) ×4 IMPLANT
CLOSURE WOUND 1/2 X4 (GAUZE/BANDAGES/DRESSINGS) ×1
CNTNR URN SCR LID CUP LEK RST (MISCELLANEOUS) ×1 IMPLANT
CONT SPEC 4OZ STRL OR WHT (MISCELLANEOUS) ×3
COVER BACK TABLE 60X90IN (DRAPES) ×3 IMPLANT
COVER WAND RF STERILE (DRAPES) ×2 IMPLANT
DERMABOND ADVANCED (GAUZE/BANDAGES/DRESSINGS) ×2
DERMABOND ADVANCED .7 DNX12 (GAUZE/BANDAGES/DRESSINGS) ×1 IMPLANT
DRAPE C-ARM 42X72 X-RAY (DRAPES) ×4 IMPLANT
DRAPE LAPAROTOMY 100X72X124 (DRAPES) ×4 IMPLANT
DRAPE MICROSCOPE LEICA (MISCELLANEOUS) ×1 IMPLANT
DRAPE SURG 17X23 STRL (DRAPES) ×4 IMPLANT
DRSG OPSITE POSTOP 4X6 (GAUZE/BANDAGES/DRESSINGS) ×2 IMPLANT
DURAPREP 26ML APPLICATOR (WOUND CARE) ×4 IMPLANT
ELECT REM PT RETURN 9FT ADLT (ELECTROSURGICAL) ×3
ELECTRODE REM PT RTRN 9FT ADLT (ELECTROSURGICAL) ×2 IMPLANT
EVACUATOR 1/8 PVC DRAIN (DRAIN) ×1 IMPLANT
FIBER BONE ALLOSYNC EXPAND 5 (Bone Implant) ×2 IMPLANT
GAUZE 4X4 16PLY RFD (DISPOSABLE) IMPLANT
GLOVE SURG ENC MOIS LTX SZ7 (GLOVE) ×2 IMPLANT
GLOVE SURG ENC MOIS LTX SZ8 (GLOVE) ×7 IMPLANT
GLOVE SURG POLYISO LF SZ7 (GLOVE) ×10 IMPLANT
GLOVE SURG UNDER POLY LF SZ7 (GLOVE) ×2 IMPLANT
GLOVE SURG UNDER POLY LF SZ7.5 (GLOVE) ×6 IMPLANT
GOWN STRL REUS W/ TWL LRG LVL3 (GOWN DISPOSABLE) IMPLANT
GOWN STRL REUS W/ TWL XL LVL3 (GOWN DISPOSABLE) ×3 IMPLANT
GOWN STRL REUS W/TWL 2XL LVL3 (GOWN DISPOSABLE) IMPLANT
GOWN STRL REUS W/TWL LRG LVL3 (GOWN DISPOSABLE) ×3
GOWN STRL REUS W/TWL XL LVL3 (GOWN DISPOSABLE) ×12
HEMOSTAT POWDER KIT SURGIFOAM (HEMOSTASIS) ×2 IMPLANT
KIT BASIN OR (CUSTOM PROCEDURE TRAY) ×4 IMPLANT
KIT BONE MRW ASP ANGEL CPRP (KITS) IMPLANT
KIT TURNOVER KIT B (KITS) ×4 IMPLANT
MILL MEDIUM DISP (BLADE) ×1 IMPLANT
NDL HYPO 25X1 1.5 SAFETY (NEEDLE) ×2 IMPLANT
NDL SPNL 20GX3.5 QUINCKE YW (NEEDLE) IMPLANT
NEEDLE HYPO 25X1 1.5 SAFETY (NEEDLE) ×3 IMPLANT
NEEDLE SPNL 20GX3.5 QUINCKE YW (NEEDLE) IMPLANT
NS IRRIG 1000ML POUR BTL (IV SOLUTION) ×4 IMPLANT
PACK LAMINECTOMY NEURO (CUSTOM PROCEDURE TRAY) ×4 IMPLANT
PAD ARMBOARD 7.5X6 YLW CONV (MISCELLANEOUS) ×12 IMPLANT
ROD LORD LIPPED TI 5.5X45 (Rod) ×4 IMPLANT
SCREW CORT SHANK MOD 6.5X40 (Screw) ×8 IMPLANT
SCREW MOD INVICTUS 6.5X40 (Screw) ×2 IMPLANT
SCREW POLYAXIAL TULIP (Screw) ×8 IMPLANT
SET SCREW (Screw) ×12 IMPLANT
SET SCREW SPNE (Screw) IMPLANT
SPACER IDENTI PS 9X9X25 5D (Spacer) ×4 IMPLANT
SPONGE LAP 4X18 RFD (DISPOSABLE) IMPLANT
SPONGE SURGIFOAM ABS GEL 100 (HEMOSTASIS) ×3 IMPLANT
SPONGE SURGIFOAM ABS GEL SZ50 (HEMOSTASIS) IMPLANT
STRIP CLOSURE SKIN 1/2X4 (GAUZE/BANDAGES/DRESSINGS) ×4 IMPLANT
SUT VIC AB 0 CT1 18XCR BRD8 (SUTURE) ×2 IMPLANT
SUT VIC AB 0 CT1 8-18 (SUTURE) ×3
SUT VIC AB 2-0 CP2 18 (SUTURE) ×4 IMPLANT
SUT VIC AB 3-0 SH 8-18 (SUTURE) ×7 IMPLANT
SYR CONTROL 10ML LL (SYRINGE) ×2 IMPLANT
TOWEL GREEN STERILE (TOWEL DISPOSABLE) ×4 IMPLANT
TOWEL GREEN STERILE FF (TOWEL DISPOSABLE) ×4 IMPLANT
TRAY FOLEY MTR SLVR 16FR STAT (SET/KITS/TRAYS/PACK) ×3 IMPLANT
WATER STERILE IRR 1000ML POUR (IV SOLUTION) ×4 IMPLANT

## 2020-10-30 NOTE — Anesthesia Preprocedure Evaluation (Signed)
Anesthesia Evaluation  Patient identified by MRN, date of birth, ID band Patient awake    Reviewed: Allergy & Precautions, H&P , NPO status , Patient's Chart, lab work & pertinent test results  Airway Mallampati: II   Neck ROM: full    Dental   Pulmonary sleep apnea , former smoker,    breath sounds clear to auscultation       Cardiovascular negative cardio ROS   Rhythm:regular Rate:Normal     Neuro/Psych  Headaches,    GI/Hepatic GERD  ,  Endo/Other    Renal/GU stones     Musculoskeletal Spinal stenosis HNP   Abdominal   Peds  Hematology   Anesthesia Other Findings   Reproductive/Obstetrics                             Anesthesia Physical Anesthesia Plan  ASA: 2  Anesthesia Plan: General   Post-op Pain Management:    Induction: Intravenous  PONV Risk Score and Plan: 2 and Ondansetron, Dexamethasone, Midazolam and Treatment may vary due to age or medical condition  Airway Management Planned: Oral ETT  Additional Equipment:   Intra-op Plan:   Post-operative Plan: Extubation in OR  Informed Consent: I have reviewed the patients History and Physical, chart, labs and discussed the procedure including the risks, benefits and alternatives for the proposed anesthesia with the patient or authorized representative who has indicated his/her understanding and acceptance.     Dental advisory given  Plan Discussed with: CRNA, Anesthesiologist and Surgeon  Anesthesia Plan Comments:         Anesthesia Quick Evaluation

## 2020-10-30 NOTE — Op Note (Signed)
10/30/2020  1:58 PM  PATIENT:  Brett Schaefer  49 y.o. 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 Decompressive lumbar laminectomy Hemi facetectomy foraminotomy L4-5 requiring more work than would be required for a simple exposure of the disk for PLIF in order to adequately decompress the neural elements and address the spinal stenosis 2. Posterior lumbar interbody fusion L4-5 using porous titanium interbody cages packed with morcellized allograft and autograft  3. Posterior fixation L4-5 using Alphatec cortical pedicle screws.  4. Intertransverse arthrodesis L4-5 using morcellized autograft and allograft. 5.  Decompressive lumbar hemilaminectomy medial facetectomy foraminotomies with sublaminar decompression L3-4 for spinal stenosis  SURGEON:  Sherley Bounds, MD  ASSISTANTS: Glenford Peers FNP  ANESTHESIA:  General  EBL: 100 ml  Total I/O In: 1000 [I.V.:1000] Out: 400 [Urine:300; Blood:100]  BLOOD ADMINISTERED:none  DRAINS: none   INDICATION FOR PROCEDURE: This patient presented with severe left leg pain in an L5 distribution. Imaging revealed recurrent disc herniation L4-5 on the left with inferior free fragment and progressive stenosis at L3-4.  He had had 2 previous microdiscectomies at L4-5 on the left by another surgeon.  The patient tried a reasonable attempt at conservative medical measures without relief. I recommended decompression and instrumented fusion to address the stenosis as well as the segmental  instability.  Patient understood the risks, benefits, and alternatives and potential outcomes and wished to proceed.  PROCEDURE DETAILS:  The patient was brought to the operating room. After induction of generalized endotracheal anesthesia the patient was rolled into the prone position on chest rolls and all pressure points were padded. The  patient's lumbar region was cleaned and then prepped with DuraPrep and draped in the usual sterile fashion. Anesthesia was injected and then a dorsal midline incision was made and carried down to the lumbosacral fascia. The fascia was opened and the paraspinous musculature was taken down in a subperiosteal fashion to expose L4-5 and L3-4. A self-retaining retractor was placed. Intraoperative fluoroscopy confirmed my level, and I started with placement of the L4 cortical pedicle screws. The pedicle screw entry zones were identified utilizing surface landmarks and  AP and lateral fluoroscopy. I scored the cortex with the high-speed drill and then used the hand drill to drill an upward and outward direction into the pedicle. I then tapped line to line. I then placed a 6.5 x 40 mm cortical pedicle screw into the pedicles of L4 bilaterally.    I then turned my attention to the decompression and complete lumbar laminectomies, hemi- facetectomies, and foraminotomies were performed at L4-5 and a left L3-4 hemilaminectomy medial facetectomy with sublaminar decompression was performed.  My nurse practitioner was directly involved in the decompression and exposure of the neural elements. the patient had significant spinal stenosis and this required more work than would be required for a simple exposure of the disc for posterior lumbar interbody fusion which would only require a limited laminotomy. Much more generous decompression and generous foraminotomy was undertaken in order to adequately decompress the neural elements and address the patient's leg pain. The yellow ligament was removed to expose the underlying dura and nerve roots at both levels, and generous foraminotomies were performed to adequately decompress the neural elements.  At L3-4 I drilled up under the spinous process and performed a sublaminar decompression to decompress the central canal and the right lateral recess . both the exiting and traversing nerve  roots were decompressed on both sides until a coronary dilator passed easily along the nerve roots. Once the decompression was complete at L3-4 and L4-5, I turned my attention to the posterior lower lumbar interbody fusion at L4-5. The epidural venous vasculature was coagulated and cut sharply. Disc space was incised and the initial discectomy was performed with pituitary rongeurs.  There was a large inferior free fragment L4-5 on the left at the pedicle level and several fragments were removed.  I used a Epstein curette to push disc down into the disc space and remove it.  Once this was done the L5 nerve root appeared to be free of compression.  The disc space was distracted with sequential distractors to a height of 10 mm. We then used a series of scrapers and shavers to prepare the endplates for fusion. The midline was prepared with Epstein curettes. Once the complete discectomy was finished, we packed an appropriate sized interbody cage with local autograft and morcellized allograft, gently retracted the nerve root, and tapped the cage into position at L4-5.  The midline between the cages was packed with morselized autograft and allograft. We then turned our attention to the placement of the lower pedicle screws. The pedicle screw entry zones were identified utilizing surface landmarks and fluoroscopy. I drilled into each pedicle utilizing the hand drill, and tapped each pedicle with the appropriate tap. We palpated with a ball probe to assure no break in the cortex. We then placed 6.5 x 40 mm pedicle screws into the pedicles bilaterally at L5.  My nurse practitioner assisted in placement of the pedicle screws.  We then decorticated the transverse processes and laid a mixture of morcellized autograft and allograft out over these to perform intertransverse arthrodesis at L4-5. We then placed lordotic rods into the multiaxial screw heads of the pedicle screws and locked these in position with the locking caps and  anti-torque device. We then checked our construct with AP and lateral fluoroscopy. Irrigated with copious amounts of bacitracin-containing saline solution. Inspected the nerve roots once again to assure adequate decompression, lined to the dura with Gelfoam,  and then we closed the muscle and the fascia with 0 Vicryl. Closed the subcutaneous tissues with 2-0 Vicryl and subcuticular tissues with 3-0 Vicryl. The skin was closed with benzoin and Steri-Strips. Dressing was then applied, the patient was awakened from general anesthesia and transported to the recovery room in stable condition. At the end of the procedure all sponge, needle and instrument counts were correct.   PLAN OF CARE: admit to inpatient  PATIENT DISPOSITION:  PACU - hemodynamically stable.   Delay start of Pharmacological VTE agent (>24hrs) due to surgical blood loss or risk of bleeding:  yes

## 2020-10-30 NOTE — Transfer of Care (Signed)
Immediate Anesthesia Transfer of Care Note  Patient: Brett Schaefer  Procedure(s) Performed: Posterior Lumbar Interbody Fusion - Lumbar four-five (Back) Decompressive Laminectomy Lumbar three-four (Back)  Patient Location: PACU  Anesthesia Type:General  Level of Consciousness: awake and alert   Airway & Oxygen Therapy: Patient Spontanous Breathing and Patient connected to face mask oxygen  Post-op Assessment: Report given to RN, Post -op Vital signs reviewed and stable and Patient moving all extremities X 4  Post vital signs: Reviewed and stable  Last Vitals:  Vitals Value Taken Time  BP 137/90 10/30/20 1405  Temp    Pulse 88 10/30/20 1408  Resp 18 10/30/20 1408  SpO2 100 % 10/30/20 1408  Vitals shown include unvalidated device data.  Last Pain:  Vitals:   10/30/20 1000  TempSrc:   PainSc: 7       Patients Stated Pain Goal: 3 (33/00/76 2263)  Complications: No notable events documented.

## 2020-10-30 NOTE — Progress Notes (Signed)
Pharmacy Antibiotic Note  Brett Schaefer is a 50 y.o. 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 Pt is S/P lumbar laminectomy and fusion this afternoon.  Pharmacy has been consulted for vancomycin dosing for post-op surgical prophylaxis.  WBC 8.0, afebrile; Scr 1.20, CrCl 98.3 ml/min   Pt rec'd vancomycin 1500 mg IV X 1 pre op at 1014 AM today. Pt has no drains in place post op, per surgeon note.  Plan: Vancomycin 1250 mg IV X 1 ~12 hrs after pre-op dose  Height: 6\' 1"  (185.4 cm) Weight: 113.4 kg (250 lb) IBW/kg (Calculated) : 79.9  Temp (24hrs), Avg:98 F (36.7 C), Min:97.7 F (36.5 C), Max:98.7 F (37.1 C)  Recent Labs  Lab 10/28/20 1200  WBC 8.0  CREATININE 1.20    Estimated Creatinine Clearance: 98.3 mL/min (by C-G formula based on SCr of 1.2 mg/dL).    Allergies  Allergen Reactions   Aspirin Hives   Ibuprofen Hives   Penicillins Hives    Microbiology results: 6/20 MRSA PCR: negative  Thank you for allowing pharmacy to be a part of this patient's care.  Gillermina Hu, PharmD, BCPS, [facility] Clinical Pharmacist 10/30/2020 3:28 PM

## 2020-10-30 NOTE — H&P (Signed)
Subjective: Patient is a 50 y.o. male admitted for recurrent disc herniation L4-5 and spinal stenosis L3-4. Onset of symptoms was a few years ago, gradually worsening since that time.  The pain is rated intense, and is located at the across the lower back and radiates to leg.  Had previous microdiscectomies at L4-5 the pain is described as aching and occurs all day. The symptoms have been progressive. Symptoms are exacerbated by exercise and standing. MRI or CT showed recurrent disc herniation L4-5 and spinal stenosis L3-4  Past Medical History:  Diagnosis Date   Allergy    COVID-19 05/03/2020   Fever    GERD (gastroesophageal reflux disease)    Heart murmur    History of kidney stones    Hx of adenomatous colonic polyps 04/16/2016   Kidney stone    Migraines    OSA on CPAP     Past Surgical History:  Procedure Laterality Date   LUMBAR LAMINECTOMY/DECOMPRESSION MICRODISCECTOMY Left 01/04/2013   Procedure: MICRO LUMBAR DECOMPRESSION L4-S1 LEFT    (2 LEVELS)Discectomy at L4-L5;  Surgeon: Johnn Hai, MD;  Location: [facility];  Service: Orthopedics;  Laterality: Left;   LUMBAR LAMINECTOMY/DECOMPRESSION MICRODISCECTOMY Left 12/21/2013   Procedure: REVISION L4 - L5 LEFT DISCECTOMY 1 LEVEL;  Surgeon: Melina Schools, MD;  Location: [place];  Service: Orthopedics;  Laterality: Left;   TONSILLECTOMY      Prior to Admission medications   Medication Sig Start Date End Date Taking? Authorizing Provider  gabapentin (NEURONTIN) 300 MG capsule Take 300 mg by mouth in the morning, at noon, in the evening, and at bedtime. 09/26/20  Yes [provider]  HYDROmorphone (DILAUDID) 2 MG tablet Take 2 mg by mouth in the morning, at noon, in the evening, and at bedtime. 10/16/20  Yes [provider]  oxyCODONE-acetaminophen (PERCOCET/ROXICET) 5-325 MG tablet Take 2 tablets by mouth every 8 (eight) hours as needed for up to 20 doses for severe pain. 09/30/20  Yes Curatolo, Adam, DO  azithromycin  (ZITHROMAX) 250 MG tablet 500 mg day 1, then 250 mg Qd x4 days Patient not taking: No sig reported 05/15/20   Kuneff, Renee A, DO  ondansetron (ZOFRAN) 4 MG tablet Take 1 tablet (4 mg total) by mouth every 6 (six) hours. Patient not taking: Reported on 10/23/2020 09/30/20   Lennice Sites, DO   Allergies  Allergen Reactions   Aspirin Hives   Ibuprofen Hives   Penicillins Hives    Social History   Tobacco Use   Smoking status: Former    Packs/day: 1.00    Years: 5.00    Pack years: 5.00    Types: Cigarettes    Quit date: 05/11/1996    Years since quitting: 24.4   Smokeless tobacco: Never  Substance Use Topics   Alcohol use: Yes    Comment: occasional    Family History  Problem Relation Age of Onset   Colon polyps Mother    Heart disease Father        passed away of MI in his 53s.    Early death Father 56     Review of Systems  Positive ROS: Negative  All other systems have been reviewed and were otherwise negative with the exception of those mentioned in the HPI and as above.  Objective: Vital signs in last 24 hours: Temp:  [98.7 F (37.1 C)] 98.7 F (37.1 C) (06/22 0928) Pulse Rate:  [112] 112 (06/22 0928) Resp:  [18] 18 (06/22 0928) BP: (142-155)/(86-103) 142/86 (06/22  7322) SpO2:  [96 %] 96 % (06/22 0928) Weight:  [113.4 kg] 113.4 kg (06/22 0928)  General Appearance: Alert, cooperative, no distress, appears stated age Head: Normocephalic, without obvious abnormality, atraumatic Eyes: PERRL, conjunctiva/corneas clear, EOM's intact    Neck: Supple, symmetrical, trachea midline Back: Symmetric, no curvature, ROM normal, no CVA tenderness Lungs:  respirations unlabored Heart: Regular rate and rhythm Abdomen: Soft, non-tender Extremities: Extremities normal, atraumatic, no cyanosis or edema Pulses: 2+ and symmetric all extremities Skin: Skin color, texture, turgor normal, no rashes or lesions  NEUROLOGIC:   Mental status: Alert and oriented x4,  no aphasia,  good attention span, fund of knowledge, and memory Motor Exam - grossly normal Sensory Exam - grossly normal Reflexes: 1+ Coordination - grossly normal Gait - grossly normal Balance - grossly normal Cranial Nerves: I: smell Not tested  II: visual acuity  OS: nl    OD: nl  II: visual fields Full to confrontation  II: pupils Equal, round, reactive to light  III,VII: ptosis None  III,IV,VI: extraocular muscles  Full ROM  V: mastication Normal  V: facial light touch sensation  Normal  V,VII: corneal reflex  Present  VII: facial muscle function - upper  Normal  VII: facial muscle function - lower Normal  VIII: hearing Not tested  IX: soft palate elevation  Normal  IX,X: gag reflex Present  XI: trapezius strength  5/5  XI: sternocleidomastoid strength 5/5  XI: neck flexion strength  5/5  XII: tongue strength  Normal    Data Review Lab Results  Component Value Date   WBC 8.0 10/28/2020   HGB 16.7 10/28/2020   HCT 47.4 10/28/2020   MCV 86.5 10/28/2020   PLT 300 10/28/2020   Lab Results  Component Value Date   NA 137 10/28/2020   K 4.2 10/28/2020   CL 102 10/28/2020   CO2 27 10/28/2020   BUN 16 10/28/2020   CREATININE 1.20 10/28/2020   GLUCOSE 98 10/28/2020   Lab Results  Component Value Date   INR 1.0 10/28/2020    Assessment/Plan:  Estimated body mass index is 32.98 kg/m as calculated from the following:   Height as of this encounter: 6\' 1"  (1.854 m).   Weight as of this encounter: 113.4 kg. Patient admitted for L4-5 decompression and instrumented fusion with L3-4 decompression. Patient has failed a reasonable attempt at conservative therapy.  I explained the condition and procedure to the patient and answered any questions.  Patient wishes to proceed with procedure as planned. Understands risks/ benefits and typical outcomes of procedure.   Eustace Moore 10/30/2020 10:42 AM

## 2020-10-30 NOTE — Anesthesia Procedure Notes (Signed)
Procedure Name: Intubation Date/Time: 10/30/2020 11:24 AM Performed by: Reece Agar, CRNA Pre-anesthesia Checklist: Patient identified, Emergency Drugs available, Suction available and Patient being monitored Patient Re-evaluated:Patient Re-evaluated prior to induction Oxygen Delivery Method: Circle System Utilized Preoxygenation: Pre-oxygenation with 100% oxygen Induction Type: IV induction Ventilation: Mask ventilation without difficulty and Oral airway inserted - appropriate to patient size Laryngoscope Size: Mac and 4 Grade View: Grade I Tube type: Oral Tube size: 7.5 mm Number of attempts: 1 Airway Equipment and Method: Stylet and Oral airway Placement Confirmation: ETT inserted through vocal cords under direct vision, positive ETCO2 and breath sounds checked- equal and bilateral Secured at: 23 cm Tube secured with: Tape Dental Injury: Teeth and Oropharynx as per pre-operative assessment

## 2020-10-31 MED ORDER — OXYCODONE-ACETAMINOPHEN 5-325 MG PO TABS: 1.0000 | ORAL_TABLET | ORAL | 0 refills | Status: DC | PRN

## 2020-10-31 MED ORDER — METHOCARBAMOL 500 MG PO TABS: 500.0000 mg | ORAL_TABLET | Freq: Four times a day (QID) | ORAL | 0 refills | Status: DC | PRN

## 2020-10-31 NOTE — Discharge Instructions (Signed)
Wound Care Keep incision covered and dry for two days.  Do not put any creams, lotions, or ointments on incision. Leave steri-strips on back.  They will fall off by themselves. Activity Walk each and every day, increasing distance each day. No lifting greater than 5 lbs.  Avoid excessive neck motion. No driving for 2 weeks; may ride as a passenger locally. If provided with back brace, wear when out of bed.  It is not necessary to wear brace in bed. Diet Resume your normal diet.  Return to Work Will be discussed at you follow up appointment. Call Your Doctor If Any of These Occur Redness, drainage, or swelling at the wound.  Temperature greater than 101 degrees. Severe pain not relieved by pain medication. Incision starts to come apart. Follow Up Appt Call [phone]) for problems.  If you have any hardware placed in your spine, you will need an x-ray before your appointment.

## 2020-10-31 NOTE — Anesthesia Postprocedure Evaluation (Signed)
Anesthesia Post Note  Patient: Brett Schaefer  Procedure(s) Performed: Posterior Lumbar Interbody Fusion - Lumbar four-five (Back) Decompressive Laminectomy Lumbar three-four (Back)     Patient location during evaluation: PACU Anesthesia Type: General Level of consciousness: awake and alert Pain management: pain level controlled Vital Signs Assessment: post-procedure vital signs reviewed and stable Respiratory status: spontaneous breathing, nonlabored ventilation, respiratory function stable and patient connected to nasal cannula oxygen Cardiovascular status: blood pressure returned to baseline and stable Postop Assessment: no apparent nausea or vomiting Anesthetic complications: no   No notable events documented.  Last Vitals:  Vitals:   10/30/20 2319 10/31/20 0513  BP: 117/75 (!) 146/82  Pulse: 89 79  Resp: 20 20  Temp: 36.9 C 37 C  SpO2: 97% 97%    Last Pain:  Vitals:   10/31/20 0600  TempSrc:   PainSc: 6                  Anetta Olvera S

## 2020-10-31 NOTE — Evaluation (Signed)
 Occupational Therapy Evaluation/Discharge Patient Details Name: Brett Schaefer MRN: [id number] DOB: Oct 04, 1970 Today's Date: 10/31/2020    History of Present Illness Brett Schaefer is a 50 y.o. 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 Pt underwent lumbar laminectomy and fusion on 6/22. PMH: GERD, COVID-19, heartmurmur, migraines, OSA on CPAP   Clinical Impression   PTA, pt lives with spouse and teenage children. Pt reports previously able to complete daily tasks though limited by severe pain and essentially bedbound for recent few months. Pt works as a Corporate treasurer. Pt presents now with much improved pain, able to demo ADLs/mobility with Modified Independence using cane. Educated on spinal precautions for ADLs/IADLs, compensatory strategies, home modifications for easier completion of daily tasks. Pt verbalized understanding of all tasks and reports family can assist with IADLs as needed. No further skilled OT services needed at acute level or on DC. OT to sign off.     Follow Up Recommendations  No OT follow up    Equipment Recommendations  None recommended by OT    Recommendations for Other Services       Precautions / Restrictions Precautions Precautions: Back Precaution Booklet Issued: Yes (comment) Precaution Comments: low fall, can ambulate to bathroom without brace, remove in bed, remove for dressing/bathing Required Braces or Orthoses: Spinal Brace Spinal Brace: Lumbar corset;Applied in sitting position Restrictions Weight Bearing Restrictions: No      Mobility Bed Mobility               General bed mobility comments: received in hallway    Transfers Overall transfer level: Modified independent Equipment used: None;Straight cane                  Balance Overall balance assessment: No apparent balance deficits (not formally assessed)                                          ADL either performed or assessed with clinical judgement   ADL Overall ADL's : Independent                                       General ADL Comments: already dressed on entry, able to bring LEs to self (was not able to do this prior to surgery), ambulating in hallway with cane, can complete toilet transfer with std toilet. Educated on strategies to trial at home for toileting (wet wipes, bidet attachement) pending needs. Also educated on [facility] vs toilet riser to increase ease of transfers though pt able to demo relatively well today. `Encouraged use of reacher to pick up items from floor if unable to squat. Educated on IADL strategies (mainly kitchen access) to maintain back precautions     Vision Patient Visual Report: No change from baseline Vision Assessment?: No apparent visual deficits     Perception     Praxis      Pertinent Vitals/Pain Pain Assessment: Faces Faces Pain Scale: Hurts a little bit Pain Location: sore at incision site Pain Descriptors / Indicators: Sore Pain Intervention(s): Monitored during session     Hand Dominance Right   Extremity/Trunk Assessment Upper Extremity Assessment Upper Extremity Assessment: Overall WFL for tasks assessed   Lower Extremity Assessment Lower Extremity Assessment: Defer to PT evaluation  Cervical / Trunk Assessment Cervical / Trunk Assessment: Normal   Communication Communication Communication: No difficulties   Cognition Arousal/Alertness: Awake/alert Behavior During Therapy: WFL for tasks assessed/performed Overall Cognitive Status: Within Functional Limits for tasks assessed                                     General Comments       Exercises     Shoulder Instructions      Home Living Family/patient expects to be discharged to:: Private residence Living Arrangements: Spouse/significant other;Children (63 and 49 y/o) Available Help at Discharge: Family Type of  Home: House Home Access: Stairs to enter Technical  of Steps: 2   Home Layout: Two level;Able to live on main level with bedroom/bathroom Alternate Level Stairs-Number of Steps: flight   Bathroom Shower/Tub: Occupational psychologist: Standard     Home Equipment: Shower seat - built in;Grab bars - tub/shower;Cane - single point          Prior Functioning/Environment Level of Independence: Independent        Comments: Pt was able to complete ADLs, mobility though very difficult in recent 3 months due to pain. Pt reports essentially bedbound but able to ambulate to bathroom as needed. 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 Wife completed most IADLs, mainly laundry        OT Problem List:        OT Treatment/Interventions:      OT Goals(Current goals can be found in the care plan section) Acute Rehab OT Goals Patient Stated Goal: return to normal activities OT Goal Formulation: All assessment and education complete, DC therapy  OT Frequency:     Barriers to D/C:            Co-evaluation              AM-PAC OT "6 Clicks" Daily Activity     Outcome Measure Help from another person eating meals?: None Help from another person taking care of personal grooming?: None Help from another person toileting, which includes using toliet, bedpan, or urinal?: None Help from another person bathing (including washing, rinsing, drying)?: None Help from another person to put on and taking off regular upper body clothing?: None Help from another person to put on and taking off regular lower body clothing?: None 6 Click Score: 24   End of Session Equipment Utilized During Treatment: Back brace Nurse Communication: Mobility status  Activity Tolerance: Patient tolerated treatment well Patient left: Other (comment) (standing in room with MD)  OT Visit Diagnosis: Pain Pain -  part of body:  (back)                Time: 0175-1025 OT Time Calculation (min): 14 min Charges:  OT General Charges $OT Visit: 1 Visit OT Evaluation $OT Eval Low Complexity: 1 Low  Malachy Chamber, OTR/L Acute Rehab Services Office: [phone]   Layla Maw 10/31/2020, 7:58 AM

## 2020-10-31 NOTE — Progress Notes (Signed)
Patient alert and oriented, voiding adequately, skin clean, dry and intact without evidence of skin break down, or symptoms of complications - no redness or edema noted, only slight tenderness at site.  Patient states pain is manageable at time of discharge. Patient has an appointment with MD in 2 weeks 

## 2020-10-31 NOTE — Evaluation (Signed)
Physical Therapy Evaluation Patient Details Name: Brett Schaefer MRN: [id number] DOB: [birth year] Today's Date: 10/31/2020   History of Present Illness  Brett Schaefer is a 50 y.o. 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 Pt underwent lumbar laminectomy and fusion on 6/22. PMH: GERD, COVID-19, heartmurmur, migraines, OSA on CPAP   Clinical Impression  Pt presents with [facility] strength (LLE improving post-surgery), good knowledge and application of spinal precautions, good activity tolerance, and mild-moderate post-surgical pain being controlled well acutely. Pt ambulated hallway distance with PT and SPC, proficiently navigated a flight of steps, overall mod I for mobility for increased time only. All PT education completed, pt with no further questions, appropriate to d/c from acute setting from PT perspective.    Follow Up Recommendations No PT follow up;Supervision for mobility/OOB    Equipment Recommendations  None recommended by PT    Recommendations for Other Services       Precautions / Restrictions Precautions Precautions: Back Precaution Booklet Issued: Yes (comment) Precaution Comments: low fall, can ambulate to bathroom without brace, remove in bed, remove for dressing/bathing Required Braces or Orthoses: Spinal Brace Spinal Brace: Lumbar corset;Applied in sitting position Restrictions Weight Bearing Restrictions: No      Mobility  Bed Mobility Overal bed mobility: Modified Independent             General bed mobility comments: mod I for increased time, pt with good application of log roll technique without PT cuing.    Transfers Overall transfer level: Modified independent Equipment used: Straight cane Transfers: Sit to/from Stand Sit to Stand: Modified independent (Device/Increase time)         General transfer comment: Mod I for increased time and use of device to  stand  Ambulation/Gait Ambulation/Gait assistance: Modified independent (Device/Increase time) Gait Distance (Feet): 250 Feet Assistive device: Straight cane Gait Pattern/deviations: Step-through pattern;Decreased stride length Gait velocity: decr   General Gait Details: mod I for use of cane and increased time, pt does not necessarily need SPC but uses for comfort and extar support as needed.  Stairs Stairs: Yes Stairs assistance: Supervision Stair Management: One rail Right;Forwards;Step to pattern;With cane Number of Stairs: 10    Wheelchair Mobility    Modified Rankin (Stroke Patients Only)       Balance Overall balance assessment: No apparent balance deficits (not formally assessed)                                           Pertinent Vitals/Pain Pain Assessment: Faces Faces Pain Scale: Hurts little more Pain Location: incisional Pain Descriptors / Indicators: Sore;Discomfort Pain Intervention(s): Limited activity within patient's tolerance;Monitored during session;Repositioned;RN gave pain meds during session    Home Living Family/patient expects to be discharged to:: Private residence Living Arrangements: Spouse/significant other;Children (51 and 45 y/o) Available Help at Discharge: Family Type of Home: House Home Access: Stairs to enter   Technical brewer of Steps: 2 Home Layout: Two level;Able to live on main level with bedroom/bathroom Home Equipment: Shower seat - built in;Grab bars - tub/shower;Cane - single point      Prior Function Level of Independence: Independent         Comments: Pt was able to complete ADLs, mobility though very difficult in recent 3 months due to pain. Pt reports essentially bedbound but able to ambulate to bathroom  as needed. 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 Wife completed most IADLs, mainly laundry     Hand  Dominance   Dominant Hand: Right    Extremity/Trunk Assessment   Upper Extremity Assessment Upper Extremity Assessment: Defer to OT evaluation    Lower Extremity Assessment Lower Extremity Assessment: Overall WFL for tasks assessed    Cervical / Trunk Assessment Cervical / Trunk Assessment: Other exceptions Cervical / Trunk Exceptions: s/p fusion  Communication   Communication: No difficulties  Cognition Arousal/Alertness: Awake/alert Behavior During Therapy: WFL for tasks assessed/performed Overall Cognitive Status: Within Functional Limits for tasks assessed                                        General Comments      Exercises Other Exercises Other Exercises: Home walking program: up and walking 1x/hour during waking hours for short household distances to minimize deconditioning, promote circulation, and decrease post-surgical stiffness   Assessment/Plan    PT Assessment Patent does not need any further PT services  PT Problem List         PT Treatment Interventions      PT Goals (Current goals can be found in the Care Plan section)  Acute Rehab PT Goals Patient Stated Goal: return to normal activities PT Goal Formulation: With patient Time For Goal Achievement: 10/31/20 Potential to Achieve Goals: Good    Frequency     Barriers to discharge        Co-evaluation               AM-PAC PT "6 Clicks" Mobility  Outcome Measure Help needed turning from your back to your side while in a flat bed without using bedrails?: None Help needed moving from lying on your back to sitting on the side of a flat bed without using bedrails?: None Help needed moving to and from a bed to a chair (including a wheelchair)?: None Help needed standing up from a chair using your arms (e.g., wheelchair or bedside chair)?: None Help needed to walk in hospital room?: None Help needed climbing 3-5 steps with a railing? : None 6 Click Score: 24    End of  Session Equipment Utilized During Treatment: Back brace Activity Tolerance: Patient tolerated treatment well Patient left: in bed;with call bell/phone within reach Nurse Communication: Mobility status PT Visit Diagnosis: Other abnormalities of gait and mobility (R26.89)    Time: 5284-1324 PT Time Calculation (min) (ACUTE ONLY): 13 min   Charges:   PT Evaluation $PT Eval Low Complexity: 1 Low          Ladan Vanderzanden S, PT DPT Acute Rehabilitation Services Pager [phone]  Office [phone]   Roxine Caddy E Ruffin Pyo 10/31/2020, 10:45 AM

## 2020-10-31 NOTE — Discharge Summary (Signed)
Physician Discharge Summary  Patient ID: Brett Schaefer MRN: [id number] DOB/AGE: 49-Apr-1972 49 y.o.  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 The patient tolerated the procedure well and was taken to the recovery room and then to the floor in stable condition. The hospital course was routine. There were no complications. The wound remained clean dry and intact. Pt had appropriate back soreness. No complaints of leg pain or new N/T/W. The patient remained afebrile with stable vital signs, and tolerated a regular diet. The patient continued to increase activities, and pain was well controlled with oral pain medications.   Consults: None  Significant Diagnostic Studies:  Results for orders placed or performed during the hospital encounter of 10/30/20  ABO/Rh  Result Value Ref Range   ABO/RH(D)      O POS Performed at [facility], [address] [place], [place] [address]     Chest 2 View  Result Date: 10/28/2020 CLINICAL DATA:  Preop.  Lumbar spinal stenosis.  HNP. EXAM: CHEST - 2 VIEW COMPARISON:  06/30/2016 FINDINGS: The cardiomediastinal contours are normal. The lungs are clear. Pulmonary vasculature is normal. No consolidation, pleural effusion, or pneumothorax. No acute osseous abnormalities are seen. IMPRESSION: Negative radiographs of the chest. Electronically Signed   By: Keith Rake M.D.   On: 10/28/2020 22:57   DG Lumbar Spine 2-3 Views  Result Date: 10/30/2020 CLINICAL DATA:  Surgery, elective. Additional history provided: L4-5 PLIF. Provided fluoroscopy time 29 seconds (24.10 mGy). EXAM: LUMBAR SPINE - 2-3 VIEW; DG C-ARM 1-60 MIN  COMPARISON:  Lumbar spine MRI 10/09/2020. FINDINGS: AP and lateral view intraoperative fluoroscopic images of the lumbar spine are submitted, 2 images total. The lowest well-formed intervertebral disc space is designated L5-S1. On the provided images, a posterior spinal fusion construct is present at L4-L5 (bilateral pedicle screws and vertical interconnecting rods). Interbody device(s) also present at L4-L5. No unexpected finding on the provided views. IMPRESSION: Two intraoperative fluoroscopic images of the lumbar spine from L4-L5 fusion, as described. Electronically Signed   By: Kellie Simmering DO   On: 10/30/2020 14:00   MR Lumbar Spine W Wo Contrast  Result Date: 10/09/2020 CLINICAL DATA:  Lumbar radiculopathy. EXAM: MRI LUMBAR SPINE WITHOUT AND WITH CONTRAST TECHNIQUE: Multiplanar and multiecho pulse sequences of the lumbar spine were obtained without and with intravenous contrast. CONTRAST:  70mL MULTIHANCE GADOBENATE DIMEGLUMINE 529 MG/ML IV SOLN COMPARISON:  MRI of lumbar spine December 10, 2016 FINDINGS: Segmentation:  Standard. Alignment:  Straightening of the lumbar lordosis. Vertebrae: No fracture, evidence of discitis, or bone lesion. Endplate degenerative changes at L4-5 and L5-S1. Postsurgical changes from left laminectomy at L4-5. Conus medullaris and cauda equina: Conus extends to the L1-2 level. Conus and cauda equina appear normal. Paraspinal and other soft tissues: Negative. Disc levels: T12-L1: No spinal neural stenosis. L1-2: Shallow disc bulge and mild facet degenerative changes without or neural stenosis. L2-3: Shallow disc bulge and mild facet degenerative changes without canal or neural stenosis. L3-4: Disc bulge with superimposed central disc protrusion mild-to-moderate facet degenerative changes and ligamentum flavum redundancy resulting in moderate spinal canal stenosis with narrowing of the bilateral subarticular zones, progressed from prior MRI. Mild bilateral neural foraminal narrowing  appears stable. L4-5: Loss of disc height, disc bulge with superimposed left subarticular disc inferiorly migrating disc extrusion causing effacement of the left subarticular zone and compressing on the traversing left L5 nerve root. The extruded component appears slightly smaller when compared to prior MRI. Moderate bilateral neural foraminal narrowing appears stable. T1 hypointense, enhancing tissue in the bilateral subarticular zones suggestive of mild peridural fibrosis. L5-S1: Loss of disc height right asymmetric disc bulge with superimposed central disc protrusion and mild-to-moderate facet degenerative changes. Findings result in narrowing of the bilateral subarticular zones, right greater left, and moderate bilateral neural foraminal narrowing, progressed from prior MRI. IMPRESSION: 1. Progression of the degenerative changes at L3-4 now with moderate spinal canal stenosis at this level. 2. Persistent left subarticular, inferiorly migrating disc extrusion at L4-5 likely impinging on the traversing left L5 nerve root. The extruded disc component appears slightly decreased in size. Persistent bilateral moderate neural foraminal stenosis at this level. 3. Progressed moderate bilateral neural foraminal narrowing at L5-S1. Electronically Signed   By: Pedro Earls M.D.   On: 10/09/2020 19:08   DG C-Arm 1-60 Min  Result Date: 10/30/2020 CLINICAL DATA:  Surgery, elective. Additional history provided: L4-5 PLIF. Provided fluoroscopy time 29 seconds (24.10 mGy). EXAM: LUMBAR SPINE - 2-3 VIEW; DG C-ARM 1-60 MIN COMPARISON:  Lumbar spine MRI 10/09/2020. FINDINGS: AP and lateral view intraoperative fluoroscopic images of the lumbar spine are submitted, 2 images total. The lowest well-formed intervertebral disc space is designated L5-S1. On the provided images, a posterior spinal fusion construct is present at L4-L5 (bilateral pedicle screws and vertical interconnecting rods). Interbody device(s) also  present at L4-L5. No unexpected finding on the provided views. IMPRESSION: Two intraoperative fluoroscopic images of the lumbar spine from L4-L5 fusion, as described. Electronically Signed   By: Kellie Simmering DO   On: 10/30/2020 14:00    Antibiotics:  Anti-infectives (From admission, onward)    Start     Dose/Rate Route Frequency Ordered Stop   10/30/20 2200  vancomycin (VANCOREADY) IVPB 1250 mg/250 mL        1,250 mg 166.7 mL/hr over 90 Minutes Intravenous  Once 10/30/20 1535 10/30/20 2235   10/30/20 0945  vancomycin (VANCOREADY) IVPB 1500 mg/300 mL        1,500 mg 150 mL/hr over 120 Minutes Intravenous On call to O.R. 10/30/20 0935 10/30/20 1145       Discharge Exam: Blood pressure (!) 146/82, pulse 79, temperature 98.6 F (37 C), temperature source Oral, resp. rate 20, height 6\' 1"  (1.854 m), weight 113.4 kg, SpO2 97 %. Neurologic: Grossly normal Ambulating and voiding well, incision cdi   Discharge Medications:   Allergies as of 10/31/2020       Reactions   Aspirin Hives   Ibuprofen Hives   Penicillins Hives        Medication List     STOP taking these medications    azithromycin 250 MG tablet Commonly known as: ZITHROMAX   ondansetron 4 MG tablet Commonly known as: ZOFRAN       TAKE these medications    gabapentin 300 MG capsule Commonly known as: NEURONTIN Take 300 mg by mouth in the morning, at noon, in the evening, and at bedtime.   HYDROmorphone 2 MG tablet Commonly known as: DILAUDID Take 2 mg by mouth in the morning, at noon, in the evening, and at bedtime.   methocarbamol 500 MG tablet Commonly known as: ROBAXIN Take 1 tablet (500 mg total) by mouth every 6 (  six) hours as needed for muscle spasms.   oxyCODONE-acetaminophen 5-325 MG tablet Commonly known as: PERCOCET/ROXICET Take 2 tablets by mouth every 8 (eight) hours as needed for up to 20 doses for severe pain. What changed: Another medication with the same name was added. Make sure you  understand how and when to take each.   oxyCODONE-acetaminophen 5-325 MG tablet Commonly known as: Percocet Take 1 tablet by mouth every 4 (four) hours as needed for severe pain. What changed: You were already taking a medication with the same name, and this prescription was added. Make sure you understand how and when to take each.               Durable Medical Equipment  (From admission, onward)           Start     Ordered   10/30/20 1519  DME Walker rolling  Once       Question:  Patient needs a walker to treat with the following condition  Answer:  S/P lumbar fusion   10/30/20 1518   10/30/20 1519  DME 3 n 1  Once        10/30/20 1518            Disposition: home   Final Dx: PLIF L4-5, left hemilam L3-4       Signed: Ocie Cornfield Zniya Cottone 10/31/2020, 7:54 AM

## 2020-11-07 ENCOUNTER — Encounter (HOSPITAL_COMMUNITY): Payer: Self-pay | Admitting: Neurological Surgery

## 2021-01-21 ENCOUNTER — Encounter: Payer: Self-pay | Admitting: Internal Medicine

## 2021-02-24 ENCOUNTER — Ambulatory Visit (AMBULATORY_SURGERY_CENTER): Payer: Managed Care, Other (non HMO) | Admitting: *Deleted

## 2021-02-24 ENCOUNTER — Other Ambulatory Visit: Payer: Self-pay

## 2021-02-24 VITALS — Ht 73.0 in | Wt 260.0 lb

## 2021-02-24 DIAGNOSIS — Z86010 Personal history of colonic polyps: Secondary | ICD-10-CM

## 2021-02-24 NOTE — Progress Notes (Signed)
Pt verified name, DOB, address and insurance during PV today.  Pt mailed instruction packet of, copy of consent form to read and not return, and instructions. PV completed over the phone.  Pt encouraged to call with questions or issues.  My Chart instructions to pt as well    No egg or soy allergy known to patient  No issues known to pt with past sedation with any surgeries or procedures Patient denies ever being told they had issues or difficulty with intubation  No FH of Malignant Hyperthermia Pt is not on diet pills Pt is not on  home 02  Pt is not on blood thinners  Pt denies issues with constipation  No A fib or A flutter  Pt is fully vaccinated  for Covid   NO PA's for preps discussed with pt In PV today  Discussed with pt there will be an out-of-pocket cost for prep and that varies from $0 to 70 +  dollars - pt verbalized understanding   Due to the COVID-19 pandemic we are asking patients to follow certain guidelines in PV and the [facility]   Pt aware of COVID protocols and [facility] guidelines

## 2021-03-20 ENCOUNTER — Ambulatory Visit (AMBULATORY_SURGERY_CENTER): Payer: Managed Care, Other (non HMO) | Admitting: Internal Medicine

## 2021-03-20 ENCOUNTER — Encounter: Payer: Self-pay | Admitting: Internal Medicine

## 2021-03-20 VITALS — BP 138/76 | HR 68 | Temp 97.8°F | Resp 14 | Ht 73.0 in | Wt 260.0 lb

## 2021-03-20 DIAGNOSIS — Z86010 Personal history of colonic polyps: Secondary | ICD-10-CM

## 2021-03-20 DIAGNOSIS — K635 Polyp of colon: Secondary | ICD-10-CM | POA: Diagnosis not present

## 2021-03-20 DIAGNOSIS — D120 Benign neoplasm of cecum: Secondary | ICD-10-CM

## 2021-03-20 MED ORDER — SODIUM CHLORIDE 0.9 % IV SOLN: 500.0000 mL | Freq: Once | INTRAVENOUS | Status: DC

## 2021-03-20 NOTE — Progress Notes (Signed)
Pt's states no medical or surgical changes since previsit or office visit. 

## 2021-03-20 NOTE — Progress Notes (Signed)
Vss nad transferred to pacu 

## 2021-03-20 NOTE — Patient Instructions (Addendum)
I found and removed 2 tiny polyps. All else ok.  I will let you know pathology results and when to have another routine colonoscopy by mail and/or My Chart.  I appreciate the opportunity to care for you. Gatha Mayer, MD, FACG  YOU HAD AN ENDOSCOPIC PROCEDURE TODAY AT [facility]:   Refer to the procedure report that was given to you for any specific questions about what was found during the examination.  If the procedure report does not answer your questions, please call your gastroenterologist to clarify.  If you requested that your care partner not be given the details of your procedure findings, then the procedure report has been included in a sealed envelope for you to review at your convenience later.  YOU SHOULD EXPECT: Some feelings of bloating in the abdomen. Passage of more gas than usual.  Walking can help get rid of the air that was put into your GI tract during the procedure and reduce the bloating. If you had a lower endoscopy (such as a colonoscopy or flexible sigmoidoscopy) you may notice spotting of blood in your stool or on the toilet paper. If you underwent a bowel prep for your procedure, you may not have a normal bowel movement for a few days.  Please Note:  You might notice some irritation and congestion in your nose or some drainage.  This is from the oxygen used during your procedure.  There is no need for concern and it should clear up in a day or so.  SYMPTOMS TO REPORT IMMEDIATELY:  Following lower endoscopy (colonoscopy or flexible sigmoidoscopy):  Excessive amounts of blood in the stool  Significant tenderness or worsening of abdominal pains  Swelling of the abdomen that is new, acute  Fever of 100F or higher  For urgent or emergent issues, a gastroenterologist can be reached at any hour by calling [phone]. Do not use MyChart messaging for urgent concerns.    DIET:  We do recommend a small meal at first, but then you may proceed to  your regular diet.  Drink plenty of fluids but you should avoid alcoholic beverages for 24 hours.  ACTIVITY:  You should plan to take it easy for the rest of today and you should NOT DRIVE or use heavy machinery until tomorrow (because of the sedation medicines used during the test).    FOLLOW UP: Our staff will call the number listed on your records 48-72 hours following your procedure to check on you and address any questions or concerns that you may have regarding the information given to you following your procedure. If we do not reach you, we will leave a message.  We will attempt to reach you two times.  During this call, we will ask if you have developed any symptoms of COVID 19. If you develop any symptoms (ie: fever, flu-like symptoms, shortness of breath, cough etc.) before then, please call [phone].  If you test positive for Covid 19 in the 2 weeks post procedure, please call and report this information to [place].    If any biopsies were taken you will be contacted by phone or by letter within the next 1-3 weeks.  Please call us at [phone] if you have not heard about the biopsies in 3 weeks.    SIGNATURES/CONFIDENTIALITY: You and/or your care partner have signed paperwork which will be entered into your electronic medical record.  These signatures attest to the fact that that the information above on your  After Visit Summary has been reviewed and is understood.  Full responsibility of the confidentiality of this discharge information lies with you and/or your care-partner.

## 2021-03-20 NOTE — Op Note (Signed)
[facility] Patient Name: Brett Schaefer Procedure Date: 03/20/2021 10:43 AM MRN: [id number] Endoscopist: Gatha Mayer , MD Age: 50 Referring MD:  Date of Birth: 05/16/1970 Gender: Male Account #: [id number] Procedure:                Colonoscopy Indications:              Surveillance: Personal history of adenomatous                            polyps on last colonoscopy 5 years ago, Last                            colonoscopy: 2017 Medicines:                Propofol per Anesthesia, Monitored Anesthesia Care Procedure:                Pre-Anesthesia Assessment:                           - Prior to the procedure, a History and Physical                            was performed, and patient medications and                            allergies were reviewed. The patient's tolerance of                            previous anesthesia was also reviewed. The risks                            and benefits of the procedure and the sedation                            options and risks were discussed with the patient.                            All questions were answered, and informed consent                            was obtained. Prior Anticoagulants: The patient has                            taken no previous anticoagulant or antiplatelet                            agents. ASA Grade Assessment: III - A patient with                            severe systemic disease. After reviewing the risks                            and benefits, the patient was deemed in  satisfactory condition to undergo the procedure.                           After obtaining informed consent, the colonoscope                            was passed under direct vision. Throughout the                            procedure, the patient's blood pressure, pulse, and                            oxygen saturations were monitored continuously. The                            #[id number]  was introduced through the anus                            and advanced to the the cecum, identified by                            appendiceal orifice and ileocecal valve. The                            colonoscopy was somewhat difficult due to                            significant looping. Successful completion of the                            procedure was aided by applying abdominal pressure.                            The patient tolerated the procedure well. The                            quality of the bowel preparation was good. The                            ileocecal valve, appendiceal orifice, and rectum                            were photographed. The bowel preparation used was                            Miralax via split dose instruction. Scope In: 11:07:05 AM Scope Out: 11:23:47 AM Scope Withdrawal Time: 0 hours 12 minutes 32 seconds  Total Procedure Duration: 0 hours 16 minutes 42 seconds  Findings:                 The perianal and digital rectal examinations were                            normal. Pertinent negatives include normal prostate                            (  size, shape, and consistency).                           Two sessile polyps were found in the appendiceal                            orifice. The polyps were diminutive in size. These                            polyps were removed with a cold snare. Resection                            and retrieval were complete. Verification of                            patient identification for the specimen was done.                            Estimated blood loss was minimal.                           The exam was otherwise without abnormality on                            direct and retroflexion views. Complications:            No immediate complications. Estimated Blood Loss:     Estimated blood loss was minimal. Impression:               - Two diminutive polyps at the appendiceal orifice,                             removed with a cold snare. Resected and retrieved.                           - The examination was otherwise normal on direct                            and retroflexion views.                           - Personal history of colonic polyps. 2017 3                            adenomas mnax 12 mm Recommendation:           - Patient has a contact number available for                            emergencies. The signs and symptoms of potential                            delayed complications were discussed with the                            patient.  Return to normal activities tomorrow.                            Written discharge instructions were provided to the                            patient.                           - Resume previous diet.                           - Continue present medications.                           - Repeat colonoscopy is recommended for                            surveillance. The colonoscopy date will be                            determined after pathology results from today's                            exam become available for review. Gatha Mayer, MD 03/20/2021 11:28:50 AM This report has been signed electronically.

## 2021-03-20 NOTE — Progress Notes (Signed)
Called to room to assist during endoscopic procedure.  Patient ID and intended procedure confirmed with present staff. Received instructions for my participation in the procedure from the performing physician.  

## 2021-03-20 NOTE — Progress Notes (Signed)
[facility] History and Physical   Primary Care Physician:  Ma Hillock, DO   Reason for Procedure:   Hx colon polyps  Plan:    colonoscopy     HPI: Brett Schaefer is a 50 y.o. male here for surveillance colonoscopy  3 adenomas max 2 mm 2017 Past Medical History:  Diagnosis Date   Allergy    COVID-19 05/03/2020   Fever    GERD (gastroesophageal reflux disease)    Heart murmur    past hx   History of kidney stones    Hx of adenomatous colonic polyps 04/16/2016   Kidney stone    Migraines    Sleep apnea    on cpap    Past Surgical History:  Procedure Laterality Date   COLONOSCOPY     LUMBAR LAMINECTOMY/DECOMPRESSION MICRODISCECTOMY Left 01/04/2013   Procedure: MICRO LUMBAR DECOMPRESSION L4-S1 LEFT    (2 LEVELS)Discectomy at L4-L5;  Surgeon: Johnn Hai, MD;  Location: [facility];  Service: Orthopedics;  Laterality: Left;   LUMBAR LAMINECTOMY/DECOMPRESSION MICRODISCECTOMY Left 12/21/2013   Procedure: REVISION L4 - L5 LEFT DISCECTOMY 1 LEVEL;  Surgeon: Melina Schools, MD;  Location: [facility];  Service: Orthopedics;  Laterality: Left;   LUMBAR LAMINECTOMY/DECOMPRESSION MICRODISCECTOMY N/A 10/30/2020   Procedure: Decompressive Laminectomy Lumbar three-four;  Surgeon: Eustace Moore, MD;  Location: [facility];  Service: Neurosurgery;  Laterality: N/A;   POLYPECTOMY     TONSILLECTOMY      Prior to Admission medications   Medication Sig Start Date End Date Taking? Authorizing Provider  ketoconazole (NIZORAL) 2 % shampoo Apply topically 2 (two) times a week. 02/12/21  Yes [provider]  meloxicam (MOBIC) 15 MG tablet Take 15 mg by mouth daily. 02/16/21  Yes [provider]  desonide (DESOWEN) 0.05 % cream SMARTSIG:Sparingly Topical Twice Daily PRN 02/15/21   [provider]    Current Outpatient Medications  Medication Sig Dispense Refill   ketoconazole (NIZORAL) 2 % shampoo Apply topically 2 (two) times a week.     meloxicam (MOBIC)  15 MG tablet Take 15 mg by mouth daily.     desonide (DESOWEN) 0.05 % cream SMARTSIG:Sparingly Topical Twice Daily PRN     Current Facility-Administered Medications  Medication Dose Route Frequency Provider Last Rate Last Admin   0.9 %  sodium chloride infusion  500 mL Intravenous Once Gatha Mayer, MD        Allergies as of 03/20/2021 - Review Complete 03/20/2021  Allergen Reaction Noted   Aspirin Hives 11/22/2010   Ibuprofen Hives 11/22/2010   Penicillins Hives 11/22/2010    Family History  Problem Relation Age of Onset   Colon polyps Mother    Heart disease Father        passed away of MI in his 88s.    Early death Father 32   Colon cancer Neg Hx    Esophageal cancer Neg Hx    Rectal cancer Neg Hx    Stomach cancer Neg Hx     Social History   Socioeconomic History   Marital status: Married    Spouse name: [name]   Number of children: 3   Years of education: 16   Highest education level: Not on file  Occupational History   Occupation: Corporate treasurer  Tobacco Use   Smoking status: Former    Packs/day: 1.00    Years: 5.00    Pack years: 5.00    Types: Cigarettes    Quit date: 05/11/1996  Years since quitting: 24.8   Smokeless tobacco: Never  Vaping Use   Vaping Use: Never used  Substance and Sexual Activity   Alcohol use: Yes    Comment: occasional   Drug use: No   Sexual activity: Yes    Partners: Female    Comment: married  Other Topics Concern   Not on file  Social History Narrative   married to [place], 3 children.   Graphic designer, BA degree.    Caffeine use.    Wears seatbelt, bicycle helmet.    Smoke detector in the home.    Feels safe in relationships.    Social Determinants of Health   Financial Resource Strain: Not on file  Food Insecurity: Not on file  Transportation Needs: Not on file  Physical Activity: Not on file  Stress: Not on file  Social Connections: Not on file  Intimate Partner Violence: Not on file    Review of  Systems:  All other review of systems negative except as mentioned in the HPI.  Physical Exam: Vital signs BP 139/86   Pulse 83   Temp 97.8 F (36.6 C)   Ht 6\' 1"  (1.854 m)   Wt 260 lb (117.9 kg)   SpO2 97%   BMI 34.30 kg/m   General:   Alert,  Well-developed, well-nourished, pleasant and cooperative in NAD Lungs:  Clear throughout to auscultation.   Heart:  Regular rate and rhythm; no murmurs, clicks, rubs,  or gallops. Abdomen:  Soft, nontender and nondistended. Normal bowel sounds.   Neuro/Psych:  Alert and cooperative. Normal mood and affect. A and O x 3   [name]  Simonne Maffucci, MD, [facility] [phone] (pager) 03/20/2021 10:57 AM@

## 2021-03-20 NOTE — Progress Notes (Signed)
[place] vital signs. 

## 2021-03-24 ENCOUNTER — Telehealth: Payer: Self-pay

## 2021-03-24 ENCOUNTER — Telehealth: Payer: Self-pay | Admitting: *Deleted

## 2021-03-24 NOTE — Telephone Encounter (Signed)
  Follow up Call-  Call back number 03/20/2021  Post procedure Call Back phone  # [phone]  Permission to leave phone message Yes  Some recent data might be hidden     Patient questions:  Do you have a fever, pain , or abdominal swelling? No. Pain Score  0 *  Have you tolerated food without any problems? Yes.    Have you been able to return to your normal activities? Yes.    Do you have any questions about your discharge instructions: Diet   No. Medications  No. Follow up visit  No.  Do you have questions or concerns about your Care? No.  Actions: * If pain score is 4 or above: No action needed, pain <4.

## 2021-03-24 NOTE — Telephone Encounter (Signed)
Attempted to call patient for their post-procedure follow-up call. No answer. Left voicemail.   

## 2021-03-25 ENCOUNTER — Encounter: Payer: Self-pay | Admitting: Internal Medicine

## 2021-04-28 ENCOUNTER — Encounter: Payer: Managed Care, Other (non HMO) | Admitting: Family Medicine

## 2021-05-28 ENCOUNTER — Encounter: Payer: Managed Care, Other (non HMO) | Admitting: Family Medicine

## 2022-05-31 IMAGING — CR DG CHEST 2V
2 series · 2 of 2 positions shown · non-contrast
Comparison: 06/30/2016

CLINICAL DATA: Preop.  Lumbar spinal stenosis.  HNP.

EXAM:
CHEST - 2 VIEW

[w chest pa]
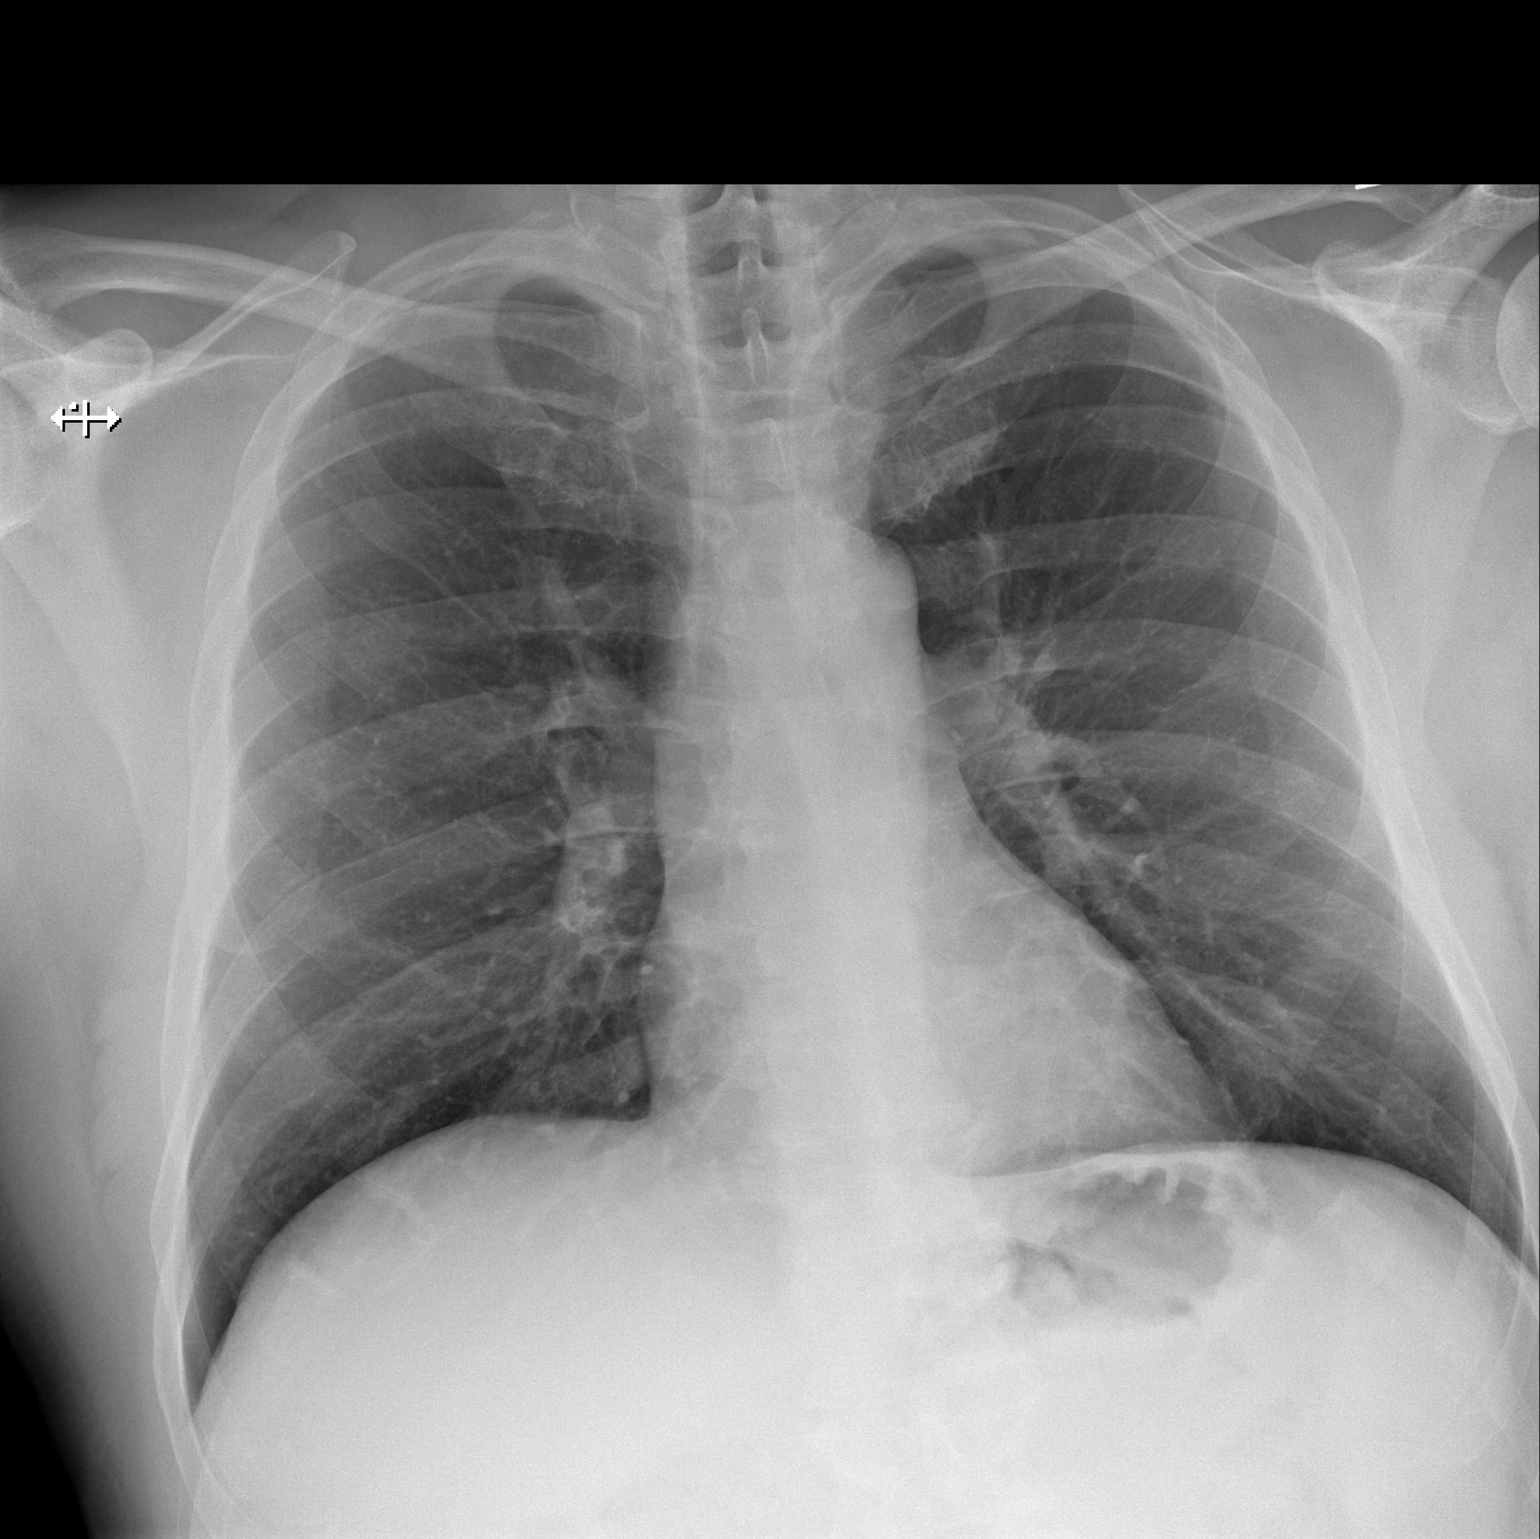

[w chest lat]
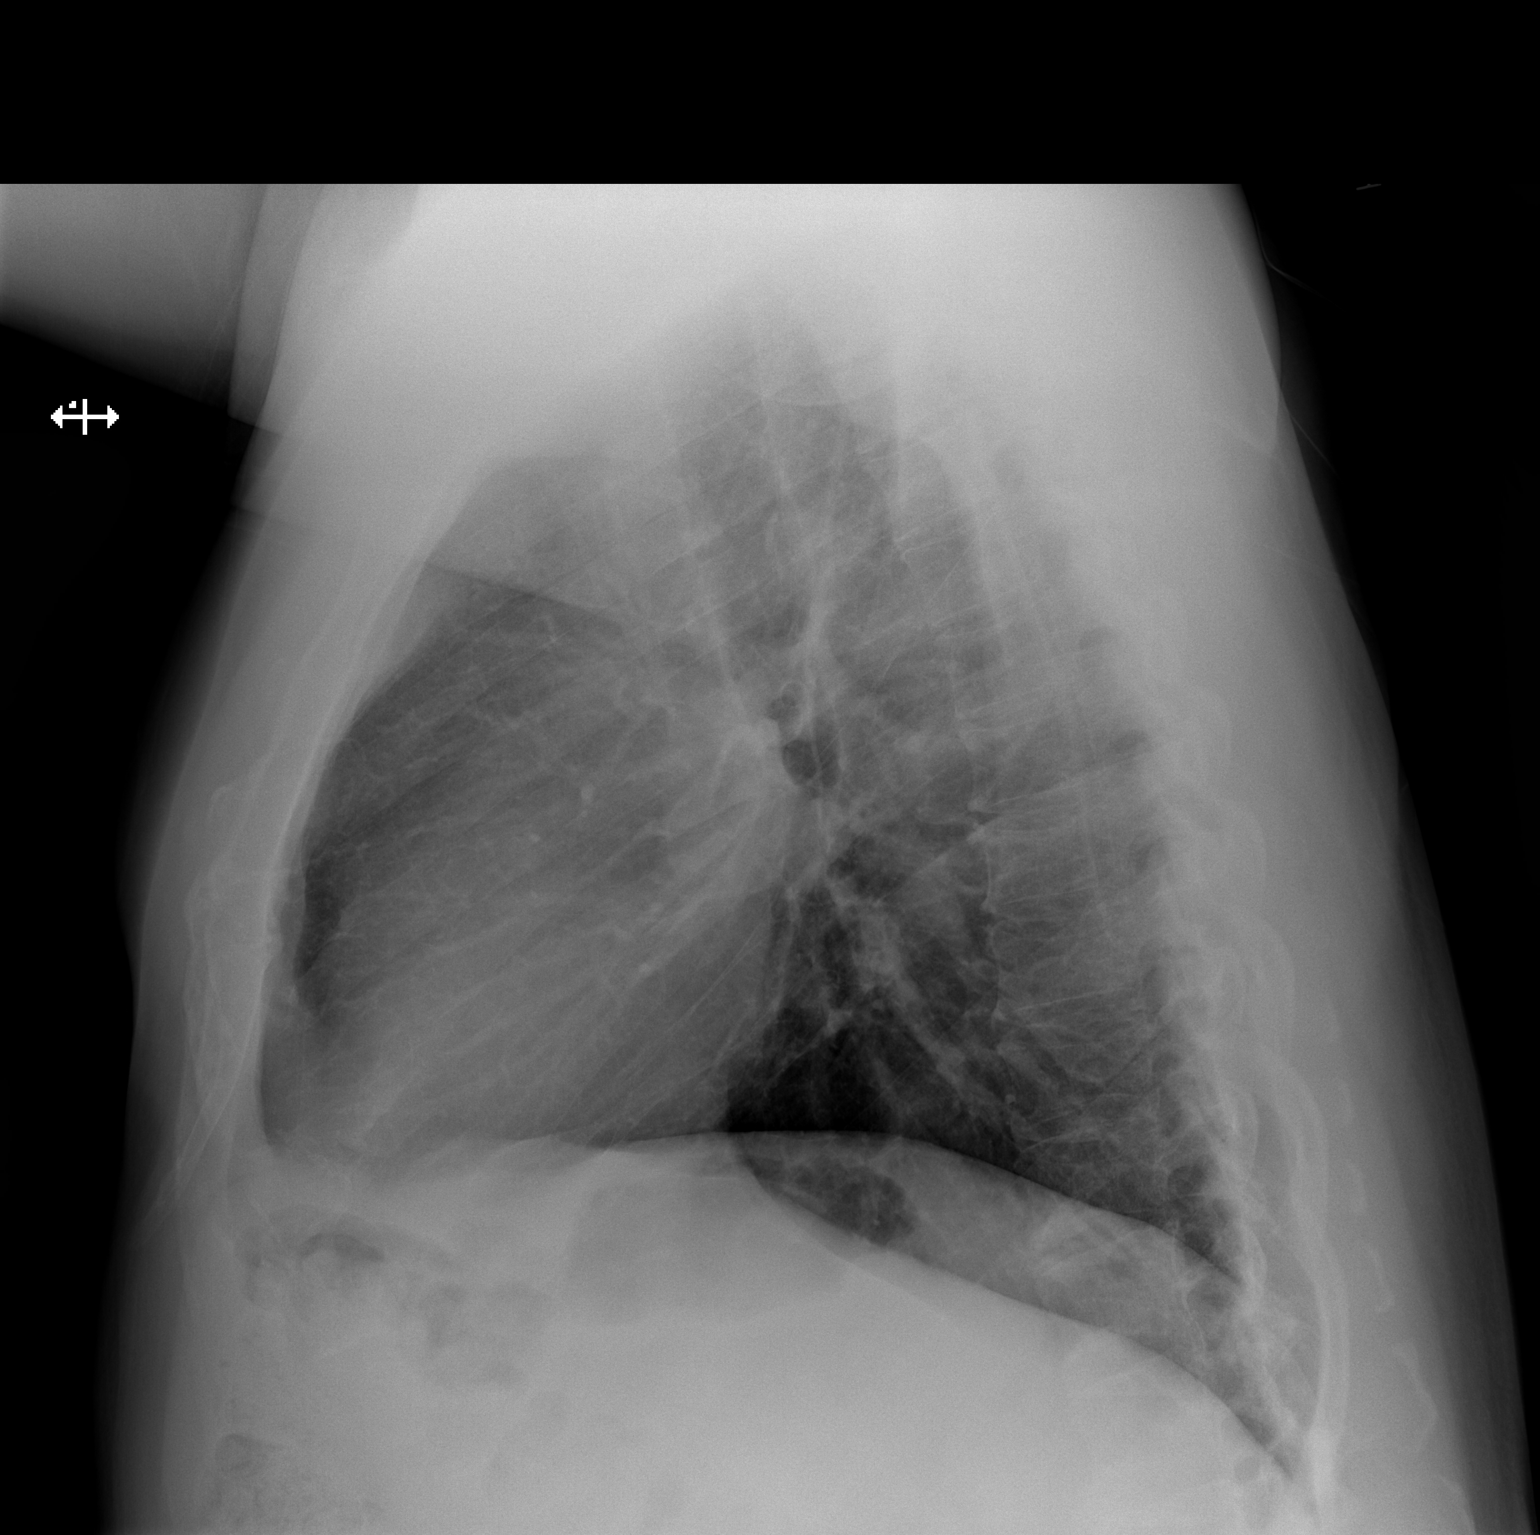

[2 of 2 positions shown; findings below may reference images not displayed]

FINDINGS: The cardiomediastinal contours are normal. The lungs are clear.
Pulmonary vasculature is normal. No consolidation, pleural effusion,
or pneumothorax. No acute osseous abnormalities are seen.
IMPRESSION: Negative radiographs of the chest.

## 2022-06-02 IMAGING — RF DG LUMBAR SPINE 2-3V
1 series · 2 of 2 positions shown · non-contrast
Comparison: Lumbar spine MRI 10/09/2020.

CLINICAL DATA: Surgery, elective. Additional history provided: L4-5
PLIF. Provided fluoroscopy time 29 seconds (24.10 mGy).

EXAM:
LUMBAR SPINE - 2-3 VIEW; DG C-ARM 1-60 MIN

[Series 1: unknown protocol · 0.14mm/px · 2 of 2 slices shown]
[im 1/2]
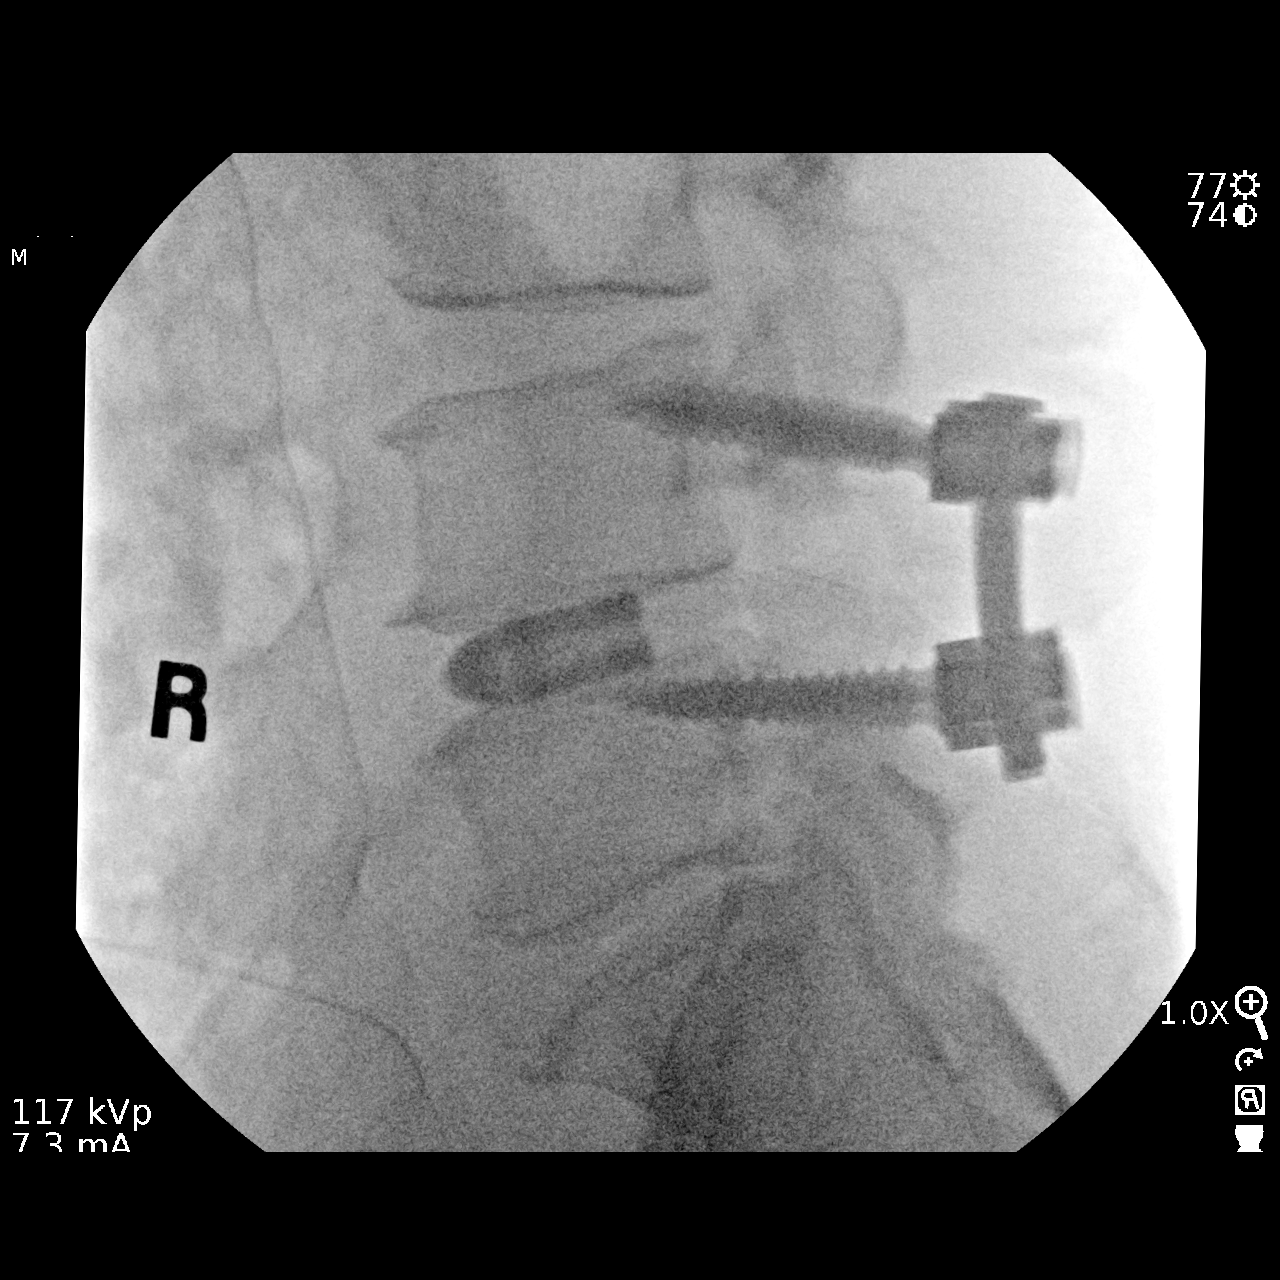
[im 2/2]
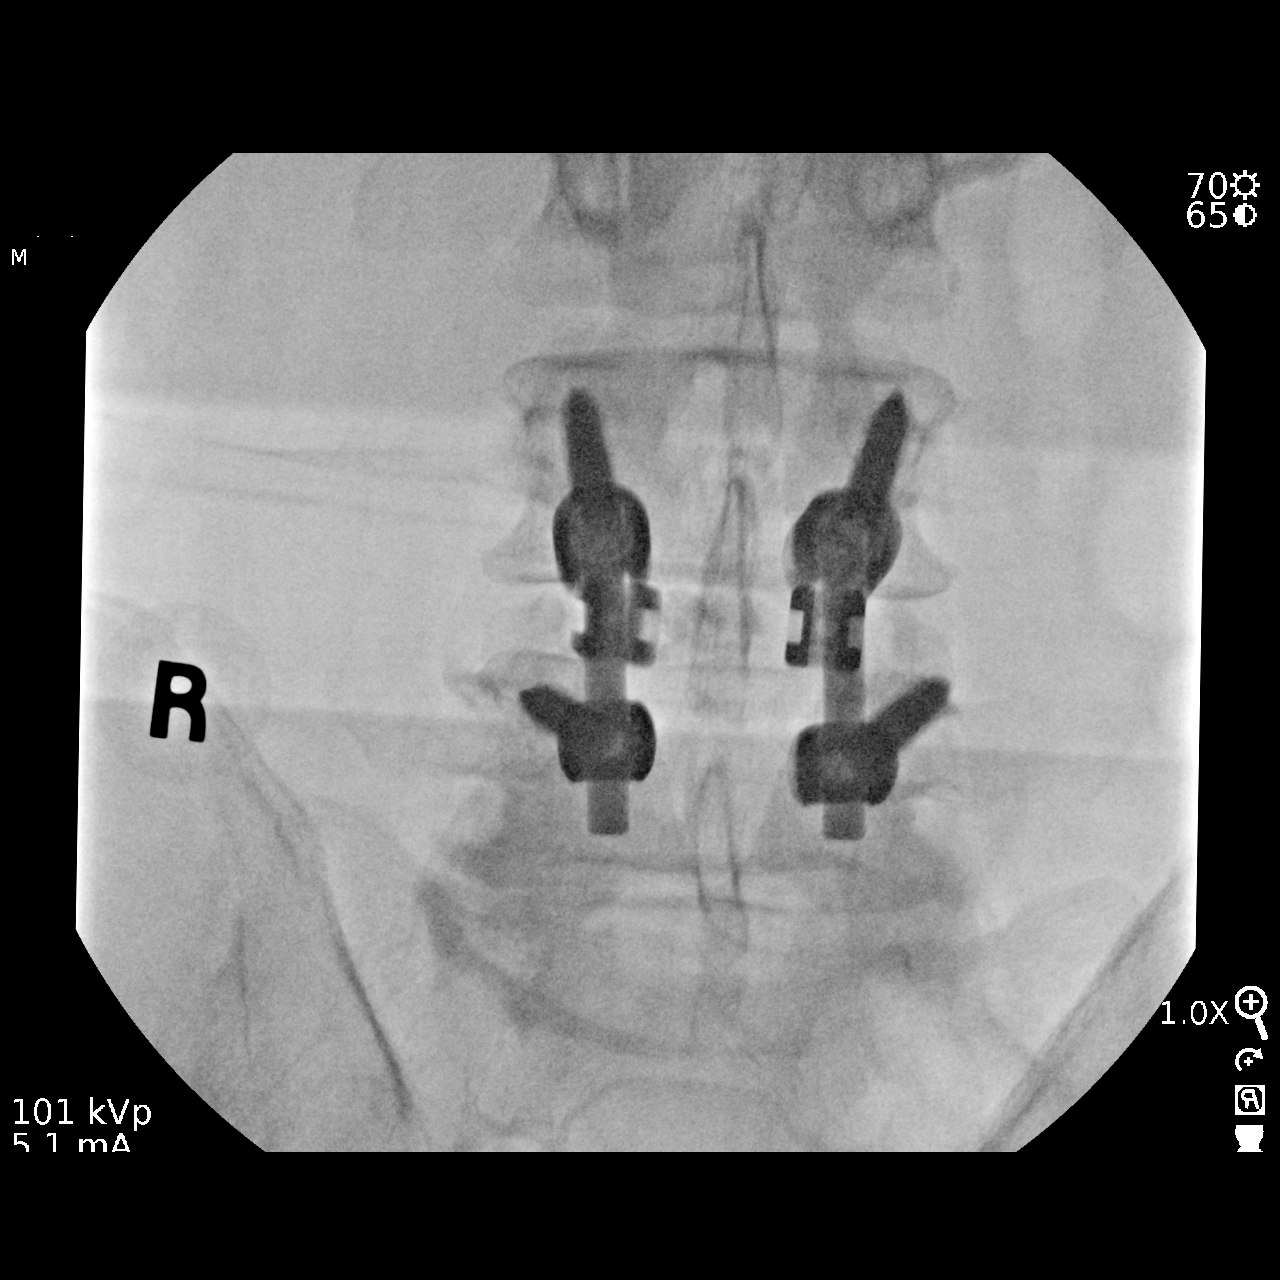

[2 of 2 positions shown; findings below may reference images not displayed]

FINDINGS: AP and lateral view intraoperative fluoroscopic images of the lumbar
spine are submitted, 2 images total. The lowest well-formed
intervertebral disc space is designated L5-S1. On the provided
images, a posterior spinal fusion construct is present at L4-L5
(bilateral pedicle screws and vertical interconnecting rods).
Interbody device(s) also present at L4-L5. No unexpected finding on
the provided views.
IMPRESSION: Two intraoperative fluoroscopic images of the lumbar spine from
L4-L5 fusion, as described.

## 2022-08-18 ENCOUNTER — Other Ambulatory Visit: Payer: Self-pay

## 2022-08-18 ENCOUNTER — Encounter (HOSPITAL_BASED_OUTPATIENT_CLINIC_OR_DEPARTMENT_OTHER): Payer: Self-pay | Admitting: Emergency Medicine

## 2022-08-18 DIAGNOSIS — K429 Umbilical hernia without obstruction or gangrene: Secondary | ICD-10-CM | POA: Diagnosis not present

## 2022-08-18 DIAGNOSIS — R1033 Periumbilical pain: Secondary | ICD-10-CM | POA: Diagnosis present

## 2022-08-18 LAB — URINALYSIS, ROUTINE W REFLEX MICROSCOPIC
Bacteria, UA: NONE SEEN
Bilirubin Urine: NEGATIVE
Glucose, UA: NEGATIVE mg/dL
Ketones, ur: NEGATIVE mg/dL
Leukocytes,Ua: NEGATIVE
Nitrite: NEGATIVE
Protein, ur: NEGATIVE mg/dL
Specific Gravity, Urine: 1.02 (ref 1.005–1.030)
pH: 6.5 (ref 5.0–8.0)

## 2022-08-18 LAB — CBC
HCT: 49 % (ref 39.0–52.0)
Hemoglobin: 17.2 g/dL — ABNORMAL HIGH (ref 13.0–17.0)
MCH: 30.6 pg (ref 26.0–34.0)
MCHC: 35.1 g/dL (ref 30.0–36.0)
MCV: 87.2 fL (ref 80.0–100.0)
Platelets: 266 10*3/uL (ref 150–400)
RBC: 5.62 MIL/uL (ref 4.22–5.81)
RDW: 13.1 % (ref 11.5–15.5)
WBC: 8.2 10*3/uL (ref 4.0–10.5)
nRBC: 0 % (ref 0.0–0.2)

## 2022-08-18 LAB — COMPREHENSIVE METABOLIC PANEL
ALT: 30 U/L (ref 0–44)
AST: 26 U/L (ref 15–41)
Albumin: 4.3 g/dL (ref 3.5–5.0)
Alkaline Phosphatase: 67 U/L (ref 38–126)
Anion gap: 9 (ref 5–15)
BUN: 13 mg/dL (ref 6–20)
CO2: 27 mmol/L (ref 22–32)
Calcium: 9.4 mg/dL (ref 8.9–10.3)
Chloride: 105 mmol/L (ref 98–111)
Creatinine, Ser: 1.13 mg/dL (ref 0.61–1.24)
GFR, Estimated: >60 mL/min (ref >60)
Glucose, Bld: 109 mg/dL — ABNORMAL HIGH (ref 70–99)
Potassium: 3.8 mmol/L (ref 3.5–5.1)
Sodium: 141 mmol/L (ref 135–145)
Total Bilirubin: 0.6 mg/dL (ref 0.3–1.2)
Total Protein: 7.3 g/dL (ref 6.5–8.1)

## 2022-08-18 LAB — LIPASE, BLOOD: Lipase: 38 U/L (ref 11–51)

## 2022-08-18 NOTE — ED Triage Notes (Signed)
"  I think I have a hernia near my belly button" noticed today Some abd pain x a few years No acute distress, denies voiding issues

## 2022-08-19 ENCOUNTER — Emergency Department (HOSPITAL_BASED_OUTPATIENT_CLINIC_OR_DEPARTMENT_OTHER)
Admission: EM | Admit: 2022-08-19 | Discharge: 2022-08-19 | Disposition: A | Discharge status: Home / Self Care | Payer: Managed Care, Other (non HMO) | Attending: Emergency Medicine | Admitting: Emergency Medicine

## 2022-08-19 ENCOUNTER — Telehealth: Payer: Self-pay

## 2022-08-19 DIAGNOSIS — K429 Umbilical hernia without obstruction or gangrene: Secondary | ICD-10-CM

## 2022-08-19 NOTE — Telephone Encounter (Signed)
Pt states he has transferred his care to Dr Almeta Monas

## 2022-08-19 NOTE — ED Provider Notes (Signed)
[place] EMERGENCY DEPARTMENT AT [facility]  Provider Note  CSN: [id number] Arrival date & time: 08/18/22 2037  History Chief Complaint  Patient presents with   Abdominal Pain    Brett Schaefer is a 52 y.o. male reports he felt a knot in his umbilicus earlier today, thinks it is a hernia. Not tender to palpation. No vomiting or fever. He has had some lower abdominal pain off and on for years, told it might be from his back. Had a CT renal stone in 2022 that was negative (may have a small umbilical hernia by my read but not mentioned by radiology).    Home Medications Prior to Admission medications   Medication Sig Start Date End Date Taking? Authorizing Provider  desonide (DESOWEN) 0.05 % cream SMARTSIG:Sparingly Topical Twice Daily PRN 02/15/21   [provider]  ketoconazole (NIZORAL) 2 % shampoo Apply topically 2 (two) times a week. 02/12/21   [provider]  meloxicam (MOBIC) 15 MG tablet Take 15 mg by mouth daily. 02/16/21   [provider]     Allergies    Aspirin, Ibuprofen, and Penicillins   Review of Systems   Review of Systems Please see HPI for pertinent positives and negatives  Physical Exam BP (!) 154/97 (BP Location: Left Arm)   Pulse 89   Temp 98.6 F (37 C)   Resp 18   SpO2 96%   Physical Exam Vitals and nursing note reviewed.  Constitutional:      Appearance: Normal appearance.  HENT:     Head: Normocephalic and atraumatic.     Nose: Nose normal.     Mouth/Throat:     Mouth: Mucous membranes are moist.  Eyes:     Extraocular Movements: Extraocular movements intact.     Conjunctiva/sclera: Conjunctivae normal.  Cardiovascular:     Rate and Rhythm: Normal rate.  Pulmonary:     Effort: Pulmonary effort is normal.     Breath sounds: Normal breath sounds.  Abdominal:     General: Abdomen is flat.     Palpations: Abdomen is soft.     Tenderness: There is no abdominal tenderness.     Hernia: A hernia is  present. Hernia is present in the umbilical area (easily reducible, not tender).  Musculoskeletal:        General: No swelling. Normal range of motion.     Cervical back: Neck supple.  Skin:    General: Skin is warm and dry.  Neurological:     General: No focal deficit present.     Mental Status: He is alert.  Psychiatric:        Mood and Affect: Mood normal.     ED Results / Procedures / Treatments   EKG None  Procedures Procedures  Medications Ordered in the ED Medications - No data to display  Initial Impression and Plan  Patient here with easily reducible small umbilical hernia. Labs done in triage show normal CBC, CMP, lipase and UA. Given hernia is easily reducible and no other signs or symptoms of strangulation/incarceration, plan discharge with outpatient Surgery follow up. Recommend he avoid heavy lifting or straining. RTED if unable to reduce, increased pain, vomiting or other concerns.   ED Course       MDM Rules/Calculators/A&P Medical Decision Making Given presenting complaint, I considered that admission might be necessary. After review of results from ED lab and/or imaging studies, admission to the hospital is not indicated at this time.    Problems  Addressed: Umbilical hernia without obstruction and without gangrene: acute illness or injury  Amount and/or Complexity of Data Reviewed Labs: ordered. Decision-making details documented in ED Course.  Risk Decision regarding hospitalization.     Final Clinical Impression(s) / ED Diagnoses Final diagnoses:  Umbilical hernia without obstruction and without gangrene    Rx / DC Orders ED Discharge Orders     None        Pollyann Savoy, MD 08/19/22 [phone]

## 2022-08-21 ENCOUNTER — Ambulatory Visit: Payer: Self-pay | Admitting: Surgery

## 2022-08-21 NOTE — H&P (Signed)
Brett Schaefer [id number]   Referring Provider:  Room, Emergency   Subjective   Chief Complaint: New Consultation (Umbilical hernia)     History of Present Illness:    52 year old male with a history of GERD, kidney stones, heart murmur, migraines and sleep apnea on CPAP, multiple prior back surgeries (2 discectomies and 1 fusion, the latter actually did afford him significant relief-he injured his back playing soccer at age 39) but no abdominal surgical history presents for evaluation of an umbilical hernia.  He initially presented to the emergency department for this 2 days ago and has been referred here.  At that time it was not tender, no obstructive symptoms, but does note a chronic history of intermittent lower abdominal pain which she had been told might be from his back.  He was found on exam to have a small reducible hernia, normal labs.  He states he always had a "half outie" but a few months ago noticed increased mass at the umbilicus.  He has had some chronic point tenderness and pain in the right lower abdominal quadrant which has been attributed to his back issues/nerve damage. Hernia was present on CT scan in 2022, see screenshot below, I reviewed the images personally.  Hernia defect at that time appeared to be less than 1 cm.  I do not see any obvious inguinal hernia or structural etiology of his right lower quadrant abdominal pain   Review of Systems: A complete review of systems was obtained from the patient.  I have reviewed this information and discussed as appropriate with the patient.  See HPI as well for other ROS.   Medical History: Past Medical History:  Diagnosis Date   Sleep apnea     There is no problem list on file for this patient.   Past Surgical History:  Procedure Laterality Date   SPINE SURGERY       Allergies  Allergen Reactions   Asprin Ec Low Dose [Aspirin] Hives   Ibuprofen Hives   Penicillin Hives    No current outpatient  medications on file prior to visit.   No current facility-administered medications on file prior to visit.    Family History  Problem Relation Age of Onset   Coronary Artery Disease (Blocked arteries around heart) Father      Social History   Tobacco Use  Smoking Status Never  Smokeless Tobacco Never     Social History   Socioeconomic History   Marital status: Married  Tobacco Use   Smoking status: Never   Smokeless tobacco: Never  Vaping Use   Vaping Use: Never used  Substance and Sexual Activity   Alcohol use: Not Currently   Drug use: Never    Objective:    Vitals:   08/21/22 1439  BP: 130/80  Pulse: 91  Temp: 36.6 C (97.9 F)  SpO2: 96%  Weight: (!) 129 kg (284 lb 6.4 oz)  Height: 185.4 cm (6\' 1" )  PainSc: 0-No pain    Body mass index is 37.52 kg/m.  Gen: A&Ox3, no distress  Eyes: lids and conjunctivae normal, no icterus.  Chest: respiratory effort is normal. Cardiovascular: RRR with palpable distal pulses, no pedal edema Gastrointestinal: soft, nondistended, nontender.  Partially reducible umbilical hernia, fascial defect not palpable Psych: appropriate mood and affect, normal insight/judgment intact  Skin: warm and dry   Labs, Imaging and Diagnostic Testing:   Assessment and Plan:  Diagnoses and all orders for this visit:  Umbilical hernia without obstruction and without  gangrene    We discussed the relevant anatomy and options for repair.  I would recommend an open approach possibly with mesh depending on fascial defect size intraoperatively.  We discussed the surgical technique and risks of bleeding, infection, pain, scarring, injury to intra-abdominal structures, wound healing problems, hematoma or seroma, as well as risk of hernia recurrence.  We discussed risk of general cardiovascular/pulmonary/thromboembolic complications of undergoing general anesthesia.  We discussed typical postop course and activity limitations and timeline.   Questions welcomed and answered.   Shawne Bulow Carlye Grippe, MD

## 2022-09-08 ENCOUNTER — Ambulatory Visit
Admission: EM | Admit: 2022-09-08 | Discharge: 2022-09-08 | Disposition: A | Discharge status: Home / Self Care | Payer: Managed Care, Other (non HMO) | Attending: Family Medicine | Admitting: Family Medicine

## 2022-09-08 ENCOUNTER — Ambulatory Visit: Payer: Managed Care, Other (non HMO)

## 2022-09-08 DIAGNOSIS — N200 Calculus of kidney: Secondary | ICD-10-CM

## 2022-09-08 DIAGNOSIS — E860 Dehydration: Secondary | ICD-10-CM

## 2022-09-08 DIAGNOSIS — R109 Unspecified abdominal pain: Secondary | ICD-10-CM

## 2022-09-08 DIAGNOSIS — R110 Nausea: Secondary | ICD-10-CM | POA: Diagnosis not present

## 2022-09-08 LAB — POCT URINALYSIS DIP (MANUAL ENTRY)
Bilirubin, UA: NEGATIVE
Glucose, UA: NEGATIVE mg/dL
Ketones, POC UA: NEGATIVE mg/dL
Leukocytes, UA: NEGATIVE
Nitrite, UA: NEGATIVE
Protein Ur, POC: NEGATIVE mg/dL
Spec Grav, UA: >=1.03 — AB (ref 1.010–1.025)
Urobilinogen, UA: 0.2 E.U./dL
pH, UA: 5.5 (ref 5.0–8.0)

## 2022-09-08 MED ORDER — ONDANSETRON 8 MG PO TBDP: 8.0000 mg | ORAL_TABLET | Freq: Three times a day (TID) | ORAL | 0 refills | Status: DC | PRN

## 2022-09-08 MED ORDER — TAMSULOSIN HCL 0.4 MG PO CAPS: 0.4000 mg | ORAL_CAPSULE | Freq: Every day | ORAL | 0 refills | Status: DC

## 2022-09-08 NOTE — ED Provider Notes (Signed)
[facility]    CSN: [id number] Arrival date & time: 09/08/22  1429      History   Chief Complaint Chief Complaint  Patient presents with   Back Pain   Nausea    HPI Brett Schaefer is a 52 y.o. male.   HPI 52 year old male presents with lower back pain x 3 days and nausea this morning. PMH significant umbilical hernia without obstruction/without gangrene, scrotal mass, morbid obesity, history of kidney stones and sleep apnea.  Patient denies fever  Past Medical History:  Diagnosis Date   Allergy    COVID-19 05/03/2020   Fever    GERD (gastroesophageal reflux disease)    Heart murmur    past hx   History of kidney stones    Hx of adenomatous colonic polyps 04/16/2016   Kidney stone    Migraines    Sleep apnea    on cpap    Patient Active Problem List   Diagnosis Date Noted   S/P lumbar fusion 10/30/2020   Kidney stone 10/01/2020   Acute recurrent maxillary sinusitis 08/05/2018   Umbilical hernia without obstruction and without gangrene 02/12/2017   OSA on CPAP 06/08/2016   Morbid obesity due to excess calories (HCC) 06/08/2016   Hx of adenomatous colonic polyps 04/16/2016   HNP (herniated nucleus pulposus), lumbar 01/04/2013    Past Surgical History:  Procedure Laterality Date   BACK SURGERY     COLONOSCOPY     LUMBAR LAMINECTOMY/DECOMPRESSION MICRODISCECTOMY Left 01/04/2013   Procedure: MICRO LUMBAR DECOMPRESSION L4-S1 LEFT    (2 LEVELS)Discectomy at L4-L5;  Surgeon: Javier Docker, MD;  Location: [facility] ORS;  Service: Orthopedics;  Laterality: Left;   LUMBAR LAMINECTOMY/DECOMPRESSION MICRODISCECTOMY Left 12/21/2013   Procedure: REVISION L4 - L5 LEFT DISCECTOMY 1 LEVEL;  Surgeon: Venita Lick, MD;  Location: [facility] OR;  Service: Orthopedics;  Laterality: Left;   LUMBAR LAMINECTOMY/DECOMPRESSION MICRODISCECTOMY N/A 10/30/2020   Procedure: Decompressive Laminectomy Lumbar three-four;  Surgeon: Tia Alert, MD;  Location: [facility] OR;  Service:  Neurosurgery;  Laterality: N/A;   POLYPECTOMY     TONSILLECTOMY         Home Medications    Prior to Admission medications   Medication Sig Start Date End Date Taking? Authorizing Provider  ondansetron (ZOFRAN-ODT) 8 MG disintegrating tablet Take 1 tablet (8 mg total) by mouth every 8 (eight) hours as needed for nausea or vomiting. 09/08/22  Yes Trevor Iha, FNP  tamsulosin (FLOMAX) 0.4 MG CAPS capsule Take 1 capsule (0.4 mg total) by mouth daily. 09/08/22 10/08/22 Yes Trevor Iha, FNP  desonide (DESOWEN) 0.05 % cream SMARTSIG:Sparingly Topical Twice Daily PRN 02/15/21   [provider]  ketoconazole (NIZORAL) 2 % shampoo Apply topically 2 (two) times a week. 02/12/21   [provider]  meloxicam (MOBIC) 15 MG tablet Take 15 mg by mouth daily. 02/16/21   [provider]    Family History Family History  Problem Relation Age of Onset   Colon polyps Mother    Heart disease Father        passed away of MI in his 22s.    Early death Father 41   Colon cancer Neg Hx    Esophageal cancer Neg Hx    Rectal cancer Neg Hx    Stomach cancer Neg Hx     Social History Social History   Tobacco Use   Smoking status: Former    Packs/day: 1.00    Years: 5.00    Additional pack years:  0.00    Total pack years: 5.00    Types: Cigarettes    Quit date: 05/11/1996    Years since quitting: 26.3   Smokeless tobacco: Never  Vaping Use   Vaping Use: Never used  Substance Use Topics   Alcohol use: Yes    Comment: occasional   Drug use: No     Allergies   Aspirin, Ibuprofen, and Penicillins   Review of Systems Review of Systems  Gastrointestinal:  Positive for nausea.  Musculoskeletal:  Positive for back pain.  All other systems reviewed and are negative.    Physical Exam Triage Vital Signs ED Triage Vitals  Enc Vitals Group     BP 09/08/22 1437 (!) 149/89     Pulse Rate 09/08/22 1437 94     Resp 09/08/22 1437 18     Temp 09/08/22 1437 97.9 F  (36.6 C)     Temp Source 09/08/22 1437 Oral     SpO2 09/08/22 1437 95 %     Weight --      Height --      Head Circumference --      Peak Flow --      Pain Score 09/08/22 1436 6     Pain Loc --      Pain Edu? --      Excl. in GC? --    No data found.  Updated Vital Signs BP (!) 149/89 (BP Location: Right Arm)   Pulse 94   Temp 97.9 F (36.6 C) (Oral)   Resp 18   SpO2 95%    Physical Exam Vitals and nursing note reviewed.  Constitutional:      General: He is not in acute distress.    Appearance: Normal appearance. He is obese. He is not ill-appearing.  HENT:     Head: Normocephalic and atraumatic.     Mouth/Throat:     Mouth: Mucous membranes are moist.     Pharynx: Oropharynx is clear.  Eyes:     Extraocular Movements: Extraocular movements intact.     Conjunctiva/sclera: Conjunctivae normal.     Pupils: Pupils are equal, round, and reactive to light.  Cardiovascular:     Rate and Rhythm: Normal rate and regular rhythm.     Pulses: Normal pulses.     Heart sounds: Normal heart sounds.  Pulmonary:     Effort: Pulmonary effort is normal.     Breath sounds: Normal breath sounds. No wheezing, rhonchi or rales.  Abdominal:     Tenderness: There is left CVA tenderness.  Musculoskeletal:        General: Normal range of motion.     Cervical back: Normal range of motion and neck supple.  Skin:    General: Skin is warm and dry.  Neurological:     General: No focal deficit present.     Mental Status: He is alert and oriented to person, place, and time. Mental status is at baseline.  Psychiatric:        Mood and Affect: Mood normal.        Behavior: Behavior normal.        Thought Content: Thought content normal.      UC Treatments / Results  Labs (all labs ordered are listed, but only abnormal results are displayed) Labs Reviewed  POCT URINALYSIS DIP (MANUAL ENTRY) - Abnormal; Notable for the following components:      Result Value   Spec Grav, UA >=1.030 (*)     Blood, UA  small (*)    All other components within normal limits    EKG   Radiology CT Renal Stone Study  Result Date: 09/08/2022 CLINICAL DATA:  Abdominal/flank pain, stone suspected Left-sided flank pain history of nephrolithiasis EXAM: CT ABDOMEN AND PELVIS WITHOUT CONTRAST TECHNIQUE: Multidetector CT imaging of the abdomen and pelvis was performed following the standard protocol without IV contrast. RADIATION DOSE REDUCTION: This exam was performed according to the departmental dose-optimization program which includes automated exposure control, adjustment of the mA and/or kV according to patient size and/or use of iterative reconstruction technique. COMPARISON:  CT Sep 30, 2020. FINDINGS: Lower chest: No acute abnormality. Hepatobiliary: No focal liver abnormality is seen. No gallstones, gallbladder wall thickening, or biliary dilatation. Pancreas: Unremarkable. No pancreatic ductal dilatation or surrounding inflammatory changes. Spleen: Normal in size without focal abnormality. Adrenals/Urinary Tract: Normal adrenal glands. Punctate nonobstructing renal calculi bilaterally. No hydronephrosis or ureteral calculi identified. Stomach/Bowel: Stomach is distended with ingested content, but otherwise within normal limits. Appendix appears normal. No evidence of bowel wall thickening, distention, or inflammatory changes. Vascular/Lymphatic: Aortic atherosclerosis. No enlarged abdominal or pelvic lymph nodes. Reproductive: Prostatomegaly. Other: No abdominal wall hernia or abnormality. No abdominopelvic ascites. Musculoskeletal: L4-L5 PLIF and lower lumbar degenerative change. IMPRESSION: Punctate nonobstructing renal calculi bilaterally. No hydronephrosis or ureteral calculi identified. Electronically Signed   By: Feliberto Harts M.D.   On: 09/08/2022 15:11    Procedures Procedures (including critical care time)  Medications Ordered in UC Medications - No data to display  Initial Impression /  Assessment and Plan / UC Course  I have reviewed the triage vital signs and the nursing notes.  Pertinent labs & imaging results that were available during my care of the patient were reviewed by me and considered in my medical decision making (see chart for details).     MDM: Acute left flank pain-CT renal stone study revealed above, advised may take OTC Tylenol 1 g every 6 hours for left flank pain; 2.  Nausea-Rx'd Zofran 8 mg 3 times daily, as needed; 3.  Dehydration-patient advised to consume 64 to 84 ounces of water per day for the next 10 days.  4.  Renal calculi-Rx'd Tamsulosin 0.4 mg capsule daily for the next 30 days. Patient advised of CT renal stone study results: Nonobstructing renal calculi bilaterally.  No hydronephrosis or ureteral calculi identified.  Advised patient to take medication as directed for the next 30 days.  Advised may take Tylenol 1000 mg every 6 hours for left flank pain. Instructed patient to increase daily water intake to 64 to 84 ounces per day for the next 10 days.  Advised if symptoms worsen and/or unresolved please follow-up with your PCP or urology for further evaluation.  Patient discharged home, hemodynamically stable. Final Clinical Impressions(s) / UC Diagnoses   Final diagnoses:  Nausea  Acute left flank pain  Dehydration  Renal calculi     Discharge Instructions      Patient advised of CT renal stone study results: Nonobstructing renal calculi bilaterally.  No hydronephrosis or ureteral calculi identified.  Advised patient to take medication as directed for the next 30 days.  Advised may take Tylenol 1000 mg every 6 hours for left flank pain. Instructed patient to increase daily water intake to 64 to 84 ounces per day for the next 10 days.  Advised if symptoms worsen and/or unresolved please follow-up with your PCP or urology for further evaluation.     ED Prescriptions     Medication Sig  Dispense Auth. Provider   tamsulosin (FLOMAX) 0.4 MG  CAPS capsule Take 1 capsule (0.4 mg total) by mouth daily. 30 capsule Trevor Iha, FNP   ondansetron (ZOFRAN-ODT) 8 MG disintegrating tablet Take 1 tablet (8 mg total) by mouth every 8 (eight) hours as needed for nausea or vomiting. 24 tablet Trevor Iha, FNP      PDMP not reviewed this encounter.   Trevor Iha, FNP 09/08/22 [phone]

## 2022-09-08 NOTE — ED Triage Notes (Signed)
Pt c/o lower back pain x 3 days. Nausea since this am. Pain is constant. Pain 6/10 Tylenol prn.

## 2022-09-08 NOTE — Discharge Instructions (Addendum)
Patient advised of CT renal stone study results: Nonobstructing renal calculi bilaterally.  No hydronephrosis or ureteral calculi identified.  Advised patient to take medication as directed for the next 30 days.  Advised may take Tylenol 1000 mg every 6 hours for left flank pain. Instructed patient to increase daily water intake to 64 to 84 ounces per day for the next 10 days.  Advised if symptoms worsen and/or unresolved please follow-up with your PCP or urology for further evaluation.

## 2022-09-16 ENCOUNTER — Encounter (HOSPITAL_BASED_OUTPATIENT_CLINIC_OR_DEPARTMENT_OTHER): Payer: Self-pay | Admitting: Surgery

## 2022-09-17 ENCOUNTER — Encounter (HOSPITAL_BASED_OUTPATIENT_CLINIC_OR_DEPARTMENT_OTHER): Payer: Self-pay | Admitting: Surgery

## 2022-09-17 NOTE — Progress Notes (Signed)
Spoke w/ via phone for pre-op interview---Brett Schaefer needs dos----   NONE            Schaefer results------ COVID test -----patient states asymptomatic no test needed Arrive at -------1015 NPO after MN NO Solid Food.  Clear liquids from MN until---0915 Med rec completed Medications to take morning of surgery -----NONE Diabetic medication ----- Patient instructed no nail polish to be worn day of surgery Patient instructed to bring photo id and insurance card day of surgery Patient aware to have Driver (ride ) / caregiver  Wife Stiven Reisenauer  for 24 hours after surgery  Patient Special Instructions ----- Pre-Op special Instructions ----- Patient verbalized understanding of instructions that were given at this phone interview. Patient denies shortness of breath, chest pain, fever, cough at this phone interview.

## 2022-09-22 ENCOUNTER — Ambulatory Visit (HOSPITAL_BASED_OUTPATIENT_CLINIC_OR_DEPARTMENT_OTHER): Payer: Managed Care, Other (non HMO) | Admitting: Anesthesiology

## 2022-09-22 ENCOUNTER — Encounter (HOSPITAL_BASED_OUTPATIENT_CLINIC_OR_DEPARTMENT_OTHER): Payer: Self-pay | Admitting: Surgery

## 2022-09-22 ENCOUNTER — Other Ambulatory Visit: Payer: Self-pay

## 2022-09-22 ENCOUNTER — Ambulatory Visit (HOSPITAL_COMMUNITY)
Admission: RE | Admit: 2022-09-22 | Discharge: 2022-09-22 | Disposition: A | Discharge status: Home / Self Care | Payer: Managed Care, Other (non HMO) | Attending: Surgery | Admitting: Surgery

## 2022-09-22 ENCOUNTER — Encounter (HOSPITAL_BASED_OUTPATIENT_CLINIC_OR_DEPARTMENT_OTHER)
Admission: RE | Disposition: A | Discharge status: Home / Self Care | Payer: Self-pay | Source: Home / Self Care | Attending: Surgery

## 2022-09-22 DIAGNOSIS — K420 Umbilical hernia with obstruction, without gangrene: Secondary | ICD-10-CM

## 2022-09-22 DIAGNOSIS — Z9989 Dependence on other enabling machines and devices: Secondary | ICD-10-CM

## 2022-09-22 DIAGNOSIS — G4733 Obstructive sleep apnea (adult) (pediatric): Secondary | ICD-10-CM

## 2022-09-22 DIAGNOSIS — Z87891 Personal history of nicotine dependence: Secondary | ICD-10-CM

## 2022-09-22 HISTORY — PX: UMBILICAL HERNIA REPAIR: SHX196

## 2022-09-22 SURGERY — REPAIR, HERNIA, UMBILICAL, ADULT
Anesthesia: General

## 2022-09-22 MED ORDER — ONDANSETRON HCL 4 MG/2ML IJ SOLN
INTRAMUSCULAR | Status: DC | PRN
  Administered 2022-09-22: 4 mg via INTRAVENOUS

## 2022-09-22 MED ORDER — GABAPENTIN 300 MG PO CAPS
ORAL_CAPSULE | ORAL | Status: AC
  Filled 2022-09-22: qty 1

## 2022-09-22 MED ORDER — MIDAZOLAM HCL 2 MG/2ML IJ SOLN
INTRAMUSCULAR | Status: AC
  Filled 2022-09-22: qty 2

## 2022-09-22 MED ORDER — SODIUM CHLORIDE 0.9% FLUSH: 3.0000 mL | Freq: Two times a day (BID) | INTRAVENOUS | Status: DC

## 2022-09-22 MED ORDER — ACETAMINOPHEN 325 MG RE SUPP: 650.0000 mg | RECTAL | Status: DC | PRN

## 2022-09-22 MED ORDER — FENTANYL CITRATE (PF) 100 MCG/2ML IJ SOLN: 25.0000 ug | INTRAMUSCULAR | Status: DC | PRN

## 2022-09-22 MED ORDER — PROPOFOL 10 MG/ML IV BOLUS
INTRAVENOUS | Status: AC
  Filled 2022-09-22: qty 20

## 2022-09-22 MED ORDER — DEXAMETHASONE SODIUM PHOSPHATE 10 MG/ML IJ SOLN
INTRAMUSCULAR | Status: AC
  Filled 2022-09-22: qty 1

## 2022-09-22 MED ORDER — ONDANSETRON HCL 4 MG/2ML IJ SOLN: 4.0000 mg | Freq: Once | INTRAMUSCULAR | Status: DC | PRN

## 2022-09-22 MED ORDER — PROPOFOL 10 MG/ML IV BOLUS
INTRAVENOUS | Status: DC | PRN
  Administered 2022-09-22: 200 mg via INTRAVENOUS

## 2022-09-22 MED ORDER — CEFAZOLIN IN SODIUM CHLORIDE 3-0.9 GM/100ML-% IV SOLN
3.0000 g | INTRAVENOUS | Status: AC
  Administered 2022-09-22: 3 g via INTRAVENOUS

## 2022-09-22 MED ORDER — ACETAMINOPHEN 325 MG PO TABS: 325.0000 mg | ORAL_TABLET | ORAL | Status: DC | PRN

## 2022-09-22 MED ORDER — MEPERIDINE HCL 25 MG/ML IJ SOLN: 6.2500 mg | INTRAMUSCULAR | Status: DC | PRN

## 2022-09-22 MED ORDER — FENTANYL CITRATE (PF) 100 MCG/2ML IJ SOLN
INTRAMUSCULAR | Status: AC
  Filled 2022-09-22: qty 2

## 2022-09-22 MED ORDER — FENTANYL CITRATE (PF) 250 MCG/5ML IJ SOLN
INTRAMUSCULAR | Status: DC | PRN
  Administered 2022-09-22: 100 ug via INTRAVENOUS

## 2022-09-22 MED ORDER — DEXMEDETOMIDINE HCL IN NACL 80 MCG/20ML IV SOLN
INTRAVENOUS | Status: DC | PRN
  Administered 2022-09-22: 12 ug via INTRAVENOUS

## 2022-09-22 MED ORDER — OXYCODONE HCL 5 MG PO TABS: 5.0000 mg | ORAL_TABLET | Freq: Once | ORAL | Status: DC | PRN

## 2022-09-22 MED ORDER — BUPIVACAINE-EPINEPHRINE 0.25% -1:200000 IJ SOLN
INTRAMUSCULAR | Status: DC | PRN
  Administered 2022-09-22: 30 mL

## 2022-09-22 MED ORDER — MIDAZOLAM HCL 2 MG/2ML IJ SOLN
INTRAMUSCULAR | Status: DC | PRN
  Administered 2022-09-22: 2 mg via INTRAVENOUS

## 2022-09-22 MED ORDER — DOCUSATE SODIUM 100 MG PO CAPS: 100.0000 mg | ORAL_CAPSULE | Freq: Two times a day (BID) | ORAL | 0 refills | Status: AC

## 2022-09-22 MED ORDER — ACETAMINOPHEN 160 MG/5ML PO SOLN: 325.0000 mg | ORAL | Status: DC | PRN

## 2022-09-22 MED ORDER — CEFAZOLIN IN SODIUM CHLORIDE 3-0.9 GM/100ML-% IV SOLN
INTRAVENOUS | Status: AC
  Filled 2022-09-22: qty 100

## 2022-09-22 MED ORDER — LIDOCAINE 2% (20 MG/ML) 5 ML SYRINGE
INTRAMUSCULAR | Status: DC | PRN
  Administered 2022-09-22: 60 mg via INTRAVENOUS

## 2022-09-22 MED ORDER — ROCURONIUM BROMIDE 10 MG/ML (PF) SYRINGE
PREFILLED_SYRINGE | INTRAVENOUS | Status: AC
  Filled 2022-09-22: qty 10

## 2022-09-22 MED ORDER — CHLORHEXIDINE GLUCONATE 4 % EX LIQD
60.0000 mL | Freq: Once | CUTANEOUS | Status: DC
  Filled 2022-09-22: qty 60

## 2022-09-22 MED ORDER — SODIUM CHLORIDE 0.9 % IV SOLN: 250.0000 mL | INTRAVENOUS | Status: DC | PRN

## 2022-09-22 MED ORDER — LIDOCAINE HCL (PF) 2 % IJ SOLN
INTRAMUSCULAR | Status: AC
  Filled 2022-09-22: qty 5

## 2022-09-22 MED ORDER — BUPIVACAINE LIPOSOME 1.3 % IJ SUSP: 20.0000 mL | Freq: Once | INTRAMUSCULAR | Status: DC

## 2022-09-22 MED ORDER — SUGAMMADEX SODIUM 200 MG/2ML IV SOLN
INTRAVENOUS | Status: DC | PRN
  Administered 2022-09-22: 240 mg via INTRAVENOUS

## 2022-09-22 MED ORDER — OXYCODONE HCL 5 MG PO TABS: 5.0000 mg | ORAL_TABLET | ORAL | Status: DC | PRN

## 2022-09-22 MED ORDER — DEXAMETHASONE SODIUM PHOSPHATE 10 MG/ML IJ SOLN
INTRAMUSCULAR | Status: DC | PRN
  Administered 2022-09-22: 10 mg via INTRAVENOUS

## 2022-09-22 MED ORDER — PHENYLEPHRINE 80 MCG/ML (10ML) SYRINGE FOR IV PUSH (FOR BLOOD PRESSURE SUPPORT)
PREFILLED_SYRINGE | INTRAVENOUS | Status: AC
  Filled 2022-09-22: qty 20

## 2022-09-22 MED ORDER — OXYCODONE HCL 5 MG/5ML PO SOLN: 5.0000 mg | Freq: Once | ORAL | Status: DC | PRN

## 2022-09-22 MED ORDER — ONDANSETRON HCL 4 MG/2ML IJ SOLN
INTRAMUSCULAR | Status: AC
  Filled 2022-09-22: qty 2

## 2022-09-22 MED ORDER — LACTATED RINGERS IV SOLN: INTRAVENOUS | Status: DC

## 2022-09-22 MED ORDER — ACETAMINOPHEN 325 MG PO TABS: 650.0000 mg | ORAL_TABLET | ORAL | Status: DC | PRN

## 2022-09-22 MED ORDER — SODIUM CHLORIDE 0.9% FLUSH: 3.0000 mL | INTRAVENOUS | Status: DC | PRN

## 2022-09-22 MED ORDER — GABAPENTIN 300 MG PO CAPS: 300.0000 mg | ORAL_CAPSULE | ORAL | Status: DC

## 2022-09-22 MED ORDER — ACETAMINOPHEN 500 MG PO TABS
1000.0000 mg | ORAL_TABLET | ORAL | Status: AC
  Administered 2022-09-22: 1000 mg via ORAL

## 2022-09-22 MED ORDER — LACTATED RINGERS IV SOLN: INTRAVENOUS | Status: DC | PRN

## 2022-09-22 MED ORDER — 0.9 % SODIUM CHLORIDE (POUR BTL) OPTIME
TOPICAL | Status: DC | PRN
  Administered 2022-09-22: 500 mL

## 2022-09-22 MED ORDER — OXYCODONE HCL 5 MG PO TABS: 5.0000 mg | ORAL_TABLET | Freq: Three times a day (TID) | ORAL | 0 refills | Status: AC | PRN

## 2022-09-22 MED ORDER — ROCURONIUM BROMIDE 10 MG/ML (PF) SYRINGE
PREFILLED_SYRINGE | INTRAVENOUS | Status: DC | PRN
  Administered 2022-09-22: 50 mg via INTRAVENOUS

## 2022-09-22 MED ORDER — ACETAMINOPHEN 500 MG PO TABS
ORAL_TABLET | ORAL | Status: AC
  Filled 2022-09-22: qty 2

## 2022-09-22 SURGICAL SUPPLY — 46 items
ADH SKN CLS APL DERMABOND .7 (GAUZE/BANDAGES/DRESSINGS)
APL PRP STRL LF DISP 70% ISPRP (MISCELLANEOUS) ×1
APL SKNCLS STERI-STRIP NONHPOA (GAUZE/BANDAGES/DRESSINGS) ×1
BALL CTTN LRG ABS STRL LF (GAUZE/BANDAGES/DRESSINGS) ×1
BENZOIN TINCTURE PRP APPL 2/3 (GAUZE/BANDAGES/DRESSINGS) IMPLANT
BLADE CLIPPER SENSICLIP SURGIC (BLADE) IMPLANT
BLADE SURG 15 STRL LF DISP TIS (BLADE) ×1 IMPLANT
BLADE SURG 15 STRL SS (BLADE) ×1
CHLORAPREP W/TINT 26 (MISCELLANEOUS) ×1 IMPLANT
COTTONBALL LRG STERILE PKG (GAUZE/BANDAGES/DRESSINGS) ×1 IMPLANT
COVER BACK TABLE 60X90IN (DRAPES) ×1 IMPLANT
COVER MAYO STAND STRL (DRAPES) ×1 IMPLANT
DERMABOND ADVANCED .7 DNX12 (GAUZE/BANDAGES/DRESSINGS) ×1 IMPLANT
DRAPE LAPAROTOMY 100X72 PEDS (DRAPES) ×1 IMPLANT
DRAPE UTILITY XL STRL (DRAPES) ×1 IMPLANT
DRSG TEGADERM 4X4.75 (GAUZE/BANDAGES/DRESSINGS) IMPLANT
ELECT REM PT RETURN 9FT ADLT (ELECTROSURGICAL) ×1
ELECTRODE REM PT RTRN 9FT ADLT (ELECTROSURGICAL) ×1 IMPLANT
GAUZE 4X4 16PLY ~~LOC~~+RFID DBL (SPONGE) ×1 IMPLANT
GAUZE SPONGE 4X4 3PLY NS LF (GAUZE/BANDAGES/DRESSINGS) IMPLANT
GLOVE BIO SURGEON STRL SZ 6 (GLOVE) ×1 IMPLANT
GLOVE INDICATOR 6.5 STRL GRN (GLOVE) ×1 IMPLANT
GOWN STRL REUS W/TWL LRG LVL3 (GOWN DISPOSABLE) ×1 IMPLANT
KIT TURNOVER CYSTO (KITS) ×1 IMPLANT
NDL HYPO 22X1.5 SAFETY MO (MISCELLANEOUS) ×1 IMPLANT
NEEDLE HYPO 22X1.5 SAFETY MO (MISCELLANEOUS) ×1 IMPLANT
NS IRRIG 500ML POUR BTL (IV SOLUTION) IMPLANT
PACK BASIN DAY SURGERY FS (CUSTOM PROCEDURE TRAY) ×1 IMPLANT
PENCIL SMOKE EVACUATOR (MISCELLANEOUS) ×1 IMPLANT
SLEEVE SCD COMPRESS KNEE MED (STOCKING) ×2 IMPLANT
SPIKE FLUID TRANSFER (MISCELLANEOUS) IMPLANT
STRIP CLOSURE SKIN 1/2X4 (GAUZE/BANDAGES/DRESSINGS) IMPLANT
SUT ETHIBOND 0 MO6 C/R (SUTURE) ×1 IMPLANT
SUT MNCRL AB 4-0 PS2 18 (SUTURE) ×1 IMPLANT
SUT VIC AB 0 SH 27 (SUTURE) IMPLANT
SUT VIC AB 2-0 SH 27 (SUTURE)
SUT VIC AB 2-0 SH 27XBRD (SUTURE) IMPLANT
SUT VIC AB 3-0 SH 27 (SUTURE) ×1
SUT VIC AB 3-0 SH 27X BRD (SUTURE) ×1 IMPLANT
SUT VICRYL 3 0 BR 18  UND (SUTURE)
SUT VICRYL 3 0 BR 18 UND (SUTURE) IMPLANT
SYR BULB IRRIG 60ML STRL (SYRINGE) IMPLANT
SYR CONTROL 10ML LL (SYRINGE) ×1 IMPLANT
TOWEL OR 17X24 6PK STRL BLUE (TOWEL DISPOSABLE) ×1 IMPLANT
TUBE CONNECTING 12X1/4 (SUCTIONS) ×1 IMPLANT
YANKAUER SUCT BULB TIP NO VENT (SUCTIONS) ×1 IMPLANT

## 2022-09-22 NOTE — Transfer of Care (Signed)
Immediate Anesthesia Transfer of Care Note  Patient: Brett Schaefer  Procedure(s) Performed: HERNIA REPAIR UMBILICAL ADULT  Patient Location: PACU  Anesthesia Type:General  Level of Consciousness: awake, alert , oriented, and patient cooperative  Airway & Oxygen Therapy: Patient Spontanous Breathing and Patient connected to face mask  Post-op Assessment: Report given to RN, Post -op Vital signs reviewed and stable, and Patient moving all extremities X 4  Post vital signs: Reviewed and stable  Last Vitals:  Vitals Value Taken Time  BP 138/86 09/22/22 1241  Temp 36.7 C 09/22/22 1241  Pulse 87 09/22/22 1245  Resp 21 09/22/22 1245  SpO2 94 % 09/22/22 1245  Vitals shown include unvalidated device data.  Last Pain:  Vitals:   09/22/22 1052  TempSrc: Oral  PainSc: 0-No pain      Patients Stated Pain Goal: 5 (09/22/22 1052)  Complications: No notable events documented.

## 2022-09-22 NOTE — Op Note (Signed)
Operative Note  CHOUA REISMAN  [id number]  [id number]  09/22/2022   Surgeon: Phylliss Blakes MD FACS   Procedure performed: Umbilical hernia repair, defect 1 cm   Preop diagnosis: Chronically incarcerated umbilical hernia.  Fascial defect not palpable, but measures 1 cm on CT Post-op diagnosis/intraop findings: Chronically incarcerated umbilical hernia containing preperitoneal fat, 1 cm fascial defect   Specimens: no Retained items: no  EBL: Minimal cc Complications: none   Description of procedure: After confirming informed consent the patient was taken to the operating room and placed supine on operating room table where general endotracheal anesthesia was initiated, preoperative antibiotics were administered, SCDs applied, and a formal timeout was performed.  The abdomen was clipped, prepped and draped in usual sterile fashion.  After infiltration with local (quarter percent Marcaine with epinephrine) an infraumbilical incision was made and soft tissue dissected with cautery until the umbilical stalk was freed from the underlying abdominal wall and the hernia sac was circumferentially dissected.  The hernia was noted to contain preperitoneal fat.  This was freed from the surrounding fascia which was cleared off of subcutaneous tissue as well circumferentially.  The incarcerated fat was reduced and the fascial defect measured 1 cm.  This was closed transversely with simple interrupted 0 Ethibond's.  Hemostasis was ensured within the wound and additional local was infiltrated to create a field block.  The umbilical skin was sutured back down to the fascia with a 3-0 Vicryl.  The incision was then closed with running subcuticular 4-0 Monocryl followed by benzoin, Steri-Strips, and a light pressure dressing. The patient was then awakened, extubated and taken to PACU in stable condition.    All counts were correct at the completion of the case.

## 2022-09-22 NOTE — Discharge Instructions (Addendum)
HERNIA REPAIR: POST OP INSTRUCTIONS   EAT Gradually transition to a high fiber diet with a fiber supplement over the next few weeks after discharge.  Start with a pureed / full liquid diet (see below)  WALK Walk an hour a day (cumulative- not all at once).  Control your pain to do that.    CONTROL PAIN Control pain so that you can walk, sleep, tolerate sneezing/coughing, and go up/down stairs.  HAVE A BOWEL MOVEMENT DAILY Keep your bowels regular to avoid problems.  OK to try a laxative to override constipation.  OK to use an antidiarrheal to slow down diarrhea.  Call if not better after 2 tries  CALL IF YOU HAVE PROBLEMS/CONCERNS Call if you are still struggling despite following these instructions. Call if you have concerns not answered by these instructions  ######################################################################    DIET: Follow a light bland diet & liquids the first 24 hours after arrival home, such as soup, liquids, starches, etc.  Be sure to drink plenty of fluids.  Quickly advance to a usual solid diet within a few days.  Avoid fast food or heavy meals initially as you are more likely to get nauseated or have irregular bowels.  A low-sugar, high-fiber diet for the rest of your life is ideal.   Take your usually prescribed home medications unless otherwise directed.  PAIN CONTROL: Pain is best controlled by a usual combination of three different methods TOGETHER: Ice/Heat Over the counter pain medication Prescription pain medication Most patients will experience some swelling and bruising around the hernia(s) such as the bellybutton, groins, or old incisions.  Ice packs or heating pads (30-60 minutes up to 6 times a day) will help. Use ice for the first few days to help decrease swelling and bruising, then switch to heat to help relax tight/sore spots and speed recovery.  Some people prefer to use ice alone, heat alone, alternating between ice & heat.  Experiment  to what works for you.  Swelling and bruising can take several weeks to resolve.   It is helpful to take an over-the-counter pain medication regularly for the first days: Naproxen (Aleve, etc)  Two 220mg tabs twice a day OR Ibuprofen (Advil, etc) Three 200mg tabs four times a day (every meal & bedtime) AND Acetaminophen (Tylenol, etc) 325-650mg four times a day (every meal & bedtime) A  prescription for pain medication should be given to you upon discharge.  Take your pain medication as prescribed, IF NEEDED.  If you are having problems/concerns with the prescription medicine (does not control pain, nausea, vomiting, rash, itching, etc), please call us [phone] to see if we need to switch you to a different pain medicine that will work better for you and/or control your side effect better. If you need a refill on your pain medication, please contact your pharmacy.  They will contact our office to request authorization. Prescriptions will not be filled after 5 pm or on week-ends.  Avoid getting constipated.  Between the surgery and the pain medications, it is common to experience some constipation.  Increasing fluid intake and taking a fiber supplement (such as Metamucil, Citrucel, FiberCon, MiraLax, etc) 1-2 times a day regularly will usually help prevent this problem from occurring.  A mild laxative (prune juice, Milk of Magnesia, MiraLax, etc) should be taken according to package directions if there are no bowel movements after 48 hours.    Wash / shower every day, starting 2 days after surgery.  You may shower over   the steri strips or skin glue which are waterproof.  No rubbing, scrubbing, lotions or ointments to incision(s). Do not soak or submerge.   Remove your outer bandage 2 days after surgery. Steri strips (if present) will peel off after 1-2 weeks. Glue (if present) will flake off after about 2 weeks.  You may leave the incision open to air.  You may replace a dressing/Band-Aid to cover  an incision for comfort if you wish.  Continue to shower over incision(s) after the dressing is off.  ACTIVITIES as tolerated:   You may resume regular (light) daily activities beginning the next day--such as daily self-care, walking, climbing stairs--gradually increasing activities as tolerated.  Control your pain so that you can walk an hour a day.  If you can walk 30 minutes without difficulty, it is safe to try more intense activity such as jogging, treadmill, bicycling, low-impact aerobics, swimming, etc. Refrain from the most intensive and strenuous activity such as sit-ups, heavy lifting, contact sports, etc  Refrain from any heavy lifting or straining until 6 weeks after surgery.   DO NOT PUSH THROUGH PAIN.  Let pain be your guide: If it hurts to do something, don't do it.  Pain is your body warning you to avoid that activity for another week until the pain goes down. You may drive when you are no longer taking prescription pain medication, you can comfortably wear a seatbelt, and you can safely maneuver your car and apply brakes. You may have sexual intercourse when it is comfortable.   FOLLOW UP in our office Please call [facility] at [phone] to set up an appointment to see your surgeon in the office for a follow-up appointment approximately 2-3 weeks after your surgery. Make sure that you call for this appointment the day you arrive home to insure a convenient appointment time.  9.  If you have disability of FMLA / Family leave forms, please bring the forms to the office for processing.  (do not give to your surgeon).  WHEN TO CALL us [phone]: Poor pain control Reactions / problems with new medications (rash/itching, nausea, etc)  Fever over 101.5 F (38.5 C) Inability to urinate Nausea and/or vomiting Worsening swelling or bruising Continued bleeding from incision. Increased pain, redness, or drainage from the incision   The clinic staff is available to answer your  questions during regular business hours (8:30am-5pm).  Please don't hesitate to call and ask to speak to one of our nurses for clinical concerns.   If you have a medical emergency, go to the nearest emergency room or call 911.  A surgeon from [facility] is always on call at the hospitals in [facility], [place] [address], [address], [place], [place]  [address] ?  [address] [address], [place], [place]   [address] MAIN: [id number] ? TOLL FREE: [phone] ? FAX: [phone] [email or web address]  Post Anesthesia Home Care Instructions  Activity: Get plenty of rest for the remainder of the day. A responsible individual must stay with you for 24 hours following the procedure.  For the next 24 hours, DO NOT: -Drive a car -Advertising copywriter -Drink alcoholic beverages -Take any medication unless instructed by your physician -Make any legal decisions or sign important papers.  Meals: Start with liquid foods such as gelatin or soup. Progress to regular foods as tolerated. Avoid greasy, spicy, heavy foods. If nausea and/or vomiting occur, drink only clear liquids until the nausea and/or vomiting subsides. Call your  physician if vomiting continues.  Special Instructions/Symptoms: Your throat may feel dry or sore from the anesthesia or the breathing tube placed in your throat during surgery. If this causes discomfort, gargle with warm salt water. The discomfort should disappear within 24 hours.  No Tylenol until 5:00 p.m.

## 2022-09-22 NOTE — Anesthesia Preprocedure Evaluation (Addendum)
Anesthesia Evaluation  Patient identified by MRN, date of birth, ID band Patient awake    Reviewed: Allergy & Precautions, H&P , NPO status , Patient's Chart, lab work & pertinent test results  Airway Mallampati: II  TM Distance: >3 FB Neck ROM: full    Dental no notable dental hx. (+) Teeth Intact, Dental Advisory Given, Caps   Pulmonary sleep apnea and Continuous Positive Airway Pressure Ventilation , former smoker   Pulmonary exam normal breath sounds clear to auscultation       Cardiovascular negative cardio ROS Normal cardiovascular exam Rhythm:regular Rate:Normal     Neuro/Psych  Headaches    GI/Hepatic ,GERD  Medicated,,  Endo/Other    Renal/GU Renal diseasestones     Musculoskeletal Spinal stenosis HNP   Abdominal   Peds  Hematology   Anesthesia Other Findings   Reproductive/Obstetrics                             Anesthesia Physical Anesthesia Plan  ASA: 2  Anesthesia Plan: General   Post-op Pain Management: Tylenol PO (pre-op)* and Celebrex PO (pre-op)*   Induction: Intravenous  PONV Risk Score and Plan: 2 and Ondansetron, Dexamethasone and Treatment may vary due to age or medical condition  Airway Management Planned: Oral ETT and LMA  Additional Equipment:   Intra-op Plan:   Post-operative Plan: Extubation in OR  Informed Consent: I have reviewed the patients History and Physical, chart, labs and discussed the procedure including the risks, benefits and alternatives for the proposed anesthesia with the patient or authorized representative who has indicated his/her understanding and acceptance.     Dental advisory given  Plan Discussed with: CRNA, Anesthesiologist and Surgeon  Anesthesia Plan Comments:         Anesthesia Quick Evaluation

## 2022-09-22 NOTE — Anesthesia Procedure Notes (Addendum)
Procedure Name: Intubation Date/Time: 09/22/2022 11:53 AM  Performed by: Dairl Ponder, CRNAPre-anesthesia Checklist: Patient identified, Emergency Drugs available, Suction available and Patient being monitored Patient Re-evaluated:Patient Re-evaluated prior to induction Oxygen Delivery Method: Circle System Utilized Preoxygenation: Pre-oxygenation with 100% oxygen Induction Type: IV induction Ventilation: Two handed mask ventilation required, Oral airway inserted - appropriate to patient size and Mask ventilation with difficulty Laryngoscope Size: Mac and 4 Grade View: Grade I Tube type: Oral Tube size: 7.5 mm Number of attempts: 1 Airway Equipment and Method: Stylet and Oral airway Placement Confirmation: ETT inserted through vocal cords under direct vision, positive ETCO2 and breath sounds checked- equal and bilateral Secured at: 23 cm Tube secured with: Tape Dental Injury: Teeth and Oropharynx as per pre-operative assessment

## 2022-09-22 NOTE — Anesthesia Postprocedure Evaluation (Signed)
Anesthesia Post Note  Patient: Brett Schaefer  Procedure(s) Performed: HERNIA REPAIR UMBILICAL ADULT     Patient location during evaluation: PACU Anesthesia Type: General Level of consciousness: awake and alert Pain management: pain level controlled Vital Signs Assessment: post-procedure vital signs reviewed and stable Respiratory status: spontaneous breathing, nonlabored ventilation, respiratory function stable and patient connected to nasal cannula oxygen Cardiovascular status: blood pressure returned to baseline and stable Postop Assessment: no apparent nausea or vomiting Anesthetic complications: no   No notable events documented.  Last Vitals:  Vitals:   09/22/22 1315 09/22/22 1340  BP: 121/79 (!) 136/91  Pulse: 81 86  Resp: 19 16  Temp: 36.7 C 36.7 C  SpO2: 91% 95%    Last Pain:  Vitals:   09/22/22 1340  TempSrc:   PainSc: 0-No pain                 Rex Magee

## 2022-09-22 NOTE — H&P (Signed)
Aragorn David Katayama [id number]   Referring Provider:  Room, Emergency   Subjective   Chief Complaint: New Consultation (Umbilical hernia)     History of Present Illness:    52-year-old male with a history of GERD, kidney stones, heart murmur, migraines and sleep apnea on CPAP, multiple prior back surgeries (2 discectomies and 1 fusion, the latter actually did afford him significant relief-he injured his back playing soccer at age 41) but no abdominal surgical history presents for evaluation of an umbilical hernia.  He initially presented to the emergency department for this 2 days ago and has been referred here.  At that time it was not tender, no obstructive symptoms, but does note a chronic history of intermittent lower abdominal pain which she had been told might be from his back.  He was found on exam to have a small reducible hernia, normal labs.  He states he always had a "half outie" but a few months ago noticed increased mass at the umbilicus.  He has had some chronic point tenderness and pain in the right lower abdominal quadrant which has been attributed to his back issues/nerve damage. Hernia was present on CT scan in 2022, see screenshot below, I reviewed the images personally.  Hernia defect at that time appeared to be less than 1 cm.  I do not see any obvious inguinal hernia or structural etiology of his right lower quadrant abdominal pain   Review of Systems: A complete review of systems was obtained from the patient.  I have reviewed this information and discussed as appropriate with the patient.  See HPI as well for other ROS.   Medical History: Past Medical History:  Diagnosis Date   Sleep apnea     There is no problem list on file for this patient.   Past Surgical History:  Procedure Laterality Date   SPINE SURGERY       Allergies  Allergen Reactions   Asprin Ec Low Dose [Aspirin] Hives   Ibuprofen Hives   Penicillin Hives    No current outpatient  medications on file prior to visit.   No current facility-administered medications on file prior to visit.    Family History  Problem Relation Age of Onset   Coronary Artery Disease (Blocked arteries around heart) Father      Social History   Tobacco Use  Smoking Status Never  Smokeless Tobacco Never     Social History   Socioeconomic History   Marital status: Married  Tobacco Use   Smoking status: Never   Smokeless tobacco: Never  Vaping Use   Vaping Use: Never used  Substance and Sexual Activity   Alcohol use: Not Currently   Drug use: Never    Objective:    Vitals:   08/21/22 1439  BP: 130/80  Pulse: 91  Temp: 36.6 C (97.9 F)  SpO2: 96%  Weight: (!) 129 kg (284 lb 6.4 oz)  Height: 185.4 cm (6' 1")  PainSc: 0-No pain    Body mass index is 37.52 kg/m.  Gen: A&Ox3, no distress  Eyes: lids and conjunctivae normal, no icterus.  Chest: respiratory effort is normal. Cardiovascular: RRR with palpable distal pulses, no pedal edema Gastrointestinal: soft, nondistended, nontender.  Partially reducible umbilical hernia, fascial defect not palpable Psych: appropriate mood and affect, normal insight/judgment intact  Skin: warm and dry   Labs, Imaging and Diagnostic Testing:   Assessment and Plan:  Diagnoses and all orders for this visit:  Umbilical hernia without obstruction and without   gangrene    We discussed the relevant anatomy and options for repair.  I would recommend an open approach possibly with mesh depending on fascial defect size intraoperatively.  We discussed the surgical technique and risks of bleeding, infection, pain, scarring, injury to intra-abdominal structures, wound healing problems, hematoma or seroma, as well as risk of hernia recurrence.  We discussed risk of general cardiovascular/pulmonary/thromboembolic complications of undergoing general anesthesia.  We discussed typical postop course and activity limitations and timeline.   Questions welcomed and answered.   Emonie Espericueta AMANDA Rahsaan Weakland, MD    

## 2022-09-23 ENCOUNTER — Encounter (HOSPITAL_BASED_OUTPATIENT_CLINIC_OR_DEPARTMENT_OTHER): Payer: Self-pay | Admitting: Surgery

## 2023-05-26 ENCOUNTER — Other Ambulatory Visit (HOSPITAL_BASED_OUTPATIENT_CLINIC_OR_DEPARTMENT_OTHER): Payer: Self-pay

## 2023-05-26 MED ORDER — ZEPBOUND 2.5 MG/0.5ML ~~LOC~~ SOAJ
2.5000 mg | SUBCUTANEOUS | 0 refills | Status: AC
  Filled 2023-05-26: qty 2, 28d supply, fill #0

## 2023-05-27 ENCOUNTER — Encounter (HOSPITAL_BASED_OUTPATIENT_CLINIC_OR_DEPARTMENT_OTHER): Payer: Self-pay

## 2023-05-31 ENCOUNTER — Other Ambulatory Visit (HOSPITAL_BASED_OUTPATIENT_CLINIC_OR_DEPARTMENT_OTHER): Payer: Self-pay

## 2023-06-07 ENCOUNTER — Other Ambulatory Visit (HOSPITAL_BASED_OUTPATIENT_CLINIC_OR_DEPARTMENT_OTHER): Payer: Self-pay

## 2023-06-21 ENCOUNTER — Other Ambulatory Visit (HOSPITAL_BASED_OUTPATIENT_CLINIC_OR_DEPARTMENT_OTHER): Payer: Self-pay
